# Patient Record
Sex: Female | Born: 1960 | Race: Black or African American | Hispanic: No | Marital: Single
Health system: Midwestern US, Community
[De-identification: ages and names within clinical notes are randomized; demographics above are authoritative.]

## PROBLEM LIST (undated history)

## (undated) DIAGNOSIS — J449 Chronic obstructive pulmonary disease, unspecified: Secondary | ICD-10-CM

## (undated) DIAGNOSIS — I10 Essential (primary) hypertension: Secondary | ICD-10-CM

## (undated) DIAGNOSIS — I671 Cerebral aneurysm, nonruptured: Secondary | ICD-10-CM

## (undated) DIAGNOSIS — F418 Other specified anxiety disorders: Secondary | ICD-10-CM

## (undated) DIAGNOSIS — M79601 Pain in right arm: Secondary | ICD-10-CM

## (undated) DIAGNOSIS — M79602 Pain in left arm: Secondary | ICD-10-CM

## (undated) HISTORY — DX: Essential (primary) hypertension: I10

## (undated) HISTORY — DX: Chronic obstructive pulmonary disease, unspecified: J44.9

---

## 2009-05-26 HISTORY — PX: OTHER SURGICAL HISTORY: SHX169

## 2013-11-03 NOTE — Progress Notes (Signed)
.  1. Have you been to the ER, urgent care clinic since your last visit?  Hospitalized since your last visit?No    2. Have you seen or consulted any other health care providers outside of the [facility] since your last visit?  Include any pap smears or colon screening. No

## 2013-11-03 NOTE — Progress Notes (Signed)
[facility]  Dawn Adaoderick Destony Prevost, MD, [place], CMD  [address]  [address]   [place] [id number]  Phone:  [phone]  Fax: [phone]    Chief Complaint   Patient presents with   ??? Complete Physical       SUBJECTIVE:    Dawn Colon Patient comes in today for evaluation and ongoing care.  She is under the care of Dr. ??(05), neurologist at [facility] for her headaches.  Patient is having sharp headaches.  Basically she states in the area she had the craniotomy.  She had an aneurysm repair October 2011.  They caught it apparently before it ruptured.  Patient also complains of low back pain for the past two or three weeks and denies other new complaints.  Patient is seen for evaluation and ongoing care.          Current Outpatient Prescriptions   Medication Sig Dispense Refill   ??? amLODIPine (NORVASC) 5 mg tablet        ??? gabapentin (NEURONTIN) 600 mg tablet        ??? aspirin delayed-release 81 mg tablet Take  by mouth daily.       ??? clonazePAM (KLONOPIN) 0.5 mg tablet        ??? DULoxetine (CYMBALTA) 30 mg capsule        ??? HYDROcodone-acetaminophen (NORCO) 7.5-325 mg per tablet        ??? ibuprofen (MOTRIN) 600 mg tablet          Past Medical History   Diagnosis Date   ??? Hypertension    ??? Aneurysm (HCC) 2011     [facility]ctomy  [facility]   ??? HA (headache)    ??? LBP (low back pain)    ??? S/P colonoscopy 11-03-13     refused   ??? Depression      psychiatry   ??? Chronic pain      Past Surgical History   Procedure Laterality Date   ??? Pr neurological procedure unlisted       No Known Allergies    REVIEW OF SYSTEMS:  General: negative for - chills or fever  ENT: negative for - headaches, nasal congestion or tinnitus  Respiratory: negative for - cough, hemoptysis, shortness of breath or wheezing  Cardiovascular : negative for - chest pain, edema, palpitations or shortness of breath  Gastrointestinal: negative for - abdominal pain, blood in stools, heartburn or nausea/vomiting  Genito-Urinary: no dysuria,  trouble voiding, or hematuria  Musculoskeletal: negative for - gait disturbance, joint pain, joint stiffness or joint swelling  Neurological: no TIA or stroke symptoms  Hematologic: no bruises, no bleeding, no swollen glands  Integument: no lumps, mole changes, nail changes or rash  Endocrine:no malaise/lethargy or unexpected weight changes      History     Social History   ??? Marital Status: SINGLE     Spouse Name: N/A     Number of Children: N/A   ??? Years of Education: N/A     Social History Main Topics   ??? Smoking status: Current Every Day Smoker   ??? Smokeless tobacco: Never Used   ??? Alcohol Use: No   ??? Drug Use: No   ??? Sexual Activity: Not on file     Other Topics Concern   ??? Not on file     Social History Narrative   ??? No narrative on file     Family History   Problem Relation Age  of Onset   ??? Diabetes Mother    ??? Hypertension Mother    Patient is separated after a marriage in 63 and a separation in 86.  She has two sons, 23 and 43.  She has 19 grandchildren.  She has permanent disability related to the aneurysm.  A good lady friend of her stays with her at home.      Family History:  Mother deceased at 34, diabetes and chronic kidney disease with dialysis.  Father is 84, alive and well.  She has two brothers and three sisters alive and well.         OBJECTIVE:     BP 113/79   Pulse 79   Temp(Src) 97.7 ??F (36.5 ??C) (Oral)   Resp 19   Ht 5\' 5"  (1.651 m)   Wt 171 lb (77.565 kg)   BMI 28.46 kg/m2   SpO2 94%  CONSTITUTIONAL: well , well nourished, appears age appropriate  EYES: perrla, eom intact  ENMT:moist mucous membranes, pharynx clear  NECK: supple. Thyroid normal  RESPIRATORY: Chest: clear to ascultation and percussion   CARDIOVASCULAR: Heart: regular rate and rhythm  GASTROINTESTINAL: Abdomen: soft, bowel sounds active  HEMATOLOGIC: no pathological lymph nodes palpated  MUSCULOSKELETAL: Extremities: no edema, pulse 1+   INTEGUMENT: No unusual rashes or suspicious skin lesions noted. Nails appear normal.   NEUROLOGIC: non-focal exam   MENTAL STATUS: alert and oriented, appropriate affect     ASSESSMENT:   1. Chronic pain    2. Depression    3. Intractable episodic tension-type headache    4. Essential hypertension    5. Aneurysm [facility])      Patient has intractable headaches for which she will need pain management referral and we will give her that today.  She has been on Oxycodone for the pain and the neurologist will not continue the Oxycodone prescription.  He wants the primary care doctor to provide it for her.  We advise her that we are not pain management and therefore cannot give a prescription for Oxycodone.  She also sees a Therapist, sports on [address].  She does not recall the name, who also gave her the name of a pain management specialist.  We will go ahead and try and make an appointment for her because she seems to be very uncomfortable.  She has been followed by psychiatry for depressive disorder.  Patient is a cigarette smoker and we encourage her to discontinue cigarettes.  We also encourage exercise for 30 minutes five days a week, weight to her ideal body weight.  Appropriate laboratory studies will be requested.  We recommend a colonoscopy.  She refused.  We will ask her to give [place] a hemoccult mailer.  She will return to the office in six weeks.  We will do appropriate laboratory studies and send her the results.  We will ask for records from [facility].        PLAN:  .  Orders Placed This Encounter   ??? MAM MAMMO BI SCREENING DIGTL   ??? URINALYSIS W/ RFLX MICROSCOPIC   ??? HEMOGLOBIN A1C   ??? CBC WITH AUTOMATED DIFF   ??? METABOLIC PANEL, COMPREHENSIVE   ??? LIPID PANEL   ??? TSH, 3RD GENERATION   ??? REFERRAL TO PAIN CLINIC   ??? AMB POC EKG ROUTINE W/ 12 LEADS, INTER & REP   ??? amLODIPine (NORVASC) 5 mg tablet   ??? clonazePAM (KLONOPIN) 0.5 mg tablet   ??? DULoxetine (CYMBALTA) 30 mg capsule   ???  gabapentin (NEURONTIN) 600 mg tablet   ??? HYDROcodone-acetaminophen (NORCO) 7.5-325 mg per tablet   ??? ibuprofen (MOTRIN) 600 mg tablet    ??? aspirin delayed-release 81 mg tablet       Follow-up Disposition:  Return in about 6 weeks (around 12/15/2013).    ATTENTION:   This medical record was transcribed using an electronic medical records system.  Although proofread, it may and can contain electronic and spelling errors.  Other human spelling and other errors may be present.  Corrections may be executed at a later time.  Please feel free to contact us for any clarifications as needed.

## 2013-11-04 NOTE — Progress Notes (Signed)
Patient not seen today. She established care with a new PCP yesterday. There was apparent confusion, as the patient thought I am a pain management physician. Patient was encouraged to follow up with her new PCP.

## 2014-03-14 ENCOUNTER — Encounter: Attending: Family

## 2014-03-27 ENCOUNTER — Encounter: Attending: Family

## 2014-04-13 ENCOUNTER — Encounter: Attending: Family

## 2014-05-05 ENCOUNTER — Encounter: Attending: Internal Medicine

## 2014-06-02 ENCOUNTER — Encounter: Attending: Internal Medicine

## 2014-07-17 ENCOUNTER — Encounter: Attending: Internal Medicine

## 2014-10-09 ENCOUNTER — Ambulatory Visit: Admit: 2014-10-09 | Discharge: 2014-10-09 | Payer: PRIVATE HEALTH INSURANCE | Attending: Internal Medicine

## 2014-10-09 ENCOUNTER — Ambulatory Visit: Attending: Internal Medicine

## 2014-10-09 DIAGNOSIS — Z7189 Other specified counseling: Secondary | ICD-10-CM

## 2014-10-09 DIAGNOSIS — Z7689 Persons encountering health services in other specified circumstances: Secondary | ICD-10-CM

## 2014-10-09 MED ORDER — AMLODIPINE 5 MG TAB
5 mg | ORAL_TABLET | Freq: Every day | ORAL | Status: DC
Start: 2014-10-09 — End: 2014-12-31

## 2014-10-09 MED ORDER — ASPIRIN 81 MG TAB, DELAYED RELEASE
81 mg | ORAL_TABLET | Freq: Every day | ORAL | Status: DC
Start: 2014-10-09 — End: 2015-12-10

## 2014-10-09 MED ORDER — MIRTAZAPINE 15 MG TAB
15 mg | ORAL_TABLET | Freq: Every evening | ORAL | Status: DC
Start: 2014-10-09 — End: 2015-03-16

## 2014-10-09 NOTE — Patient Instructions (Signed)
Headache: Care Instructions  Your Care Instructions     Headaches have many possible causes. Most headaches aren't a sign of a more serious problem, and they will get better on their own. Home treatment may help you feel better faster.  The doctor has checked you carefully, but problems can develop later. If you notice any problems or new symptoms, get medical treatment right away.  Follow-up care is a key part of your treatment and safety. Be sure to make and go to all appointments, and call your doctor if you are having problems. It's also a good idea to know your test results and keep a list of the medicines you take.  How can you care for yourself at home?  ?? Do not drive if you have taken a prescription pain medicine.  ?? Rest in a quiet, dark room until your headache is gone. Close your eyes and try to relax or go to sleep. Don't watch TV or read.  ?? Put a cold, moist cloth or cold pack on the painful area for 10 to 20 minutes at a time. Put a thin cloth between the cold pack and your skin.  ?? Use a warm, moist towel or a heating pad set on low to relax tight shoulder and neck muscles.  ?? Have someone gently massage your neck and shoulders.  ?? Take pain medicines exactly as directed.  ?? If the doctor gave you a prescription medicine for pain, take it as prescribed.  ?? If you are not taking a prescription pain medicine, ask your doctor if you can take an over-the-counter medicine.  ?? Be careful not to take pain medicine more often than the instructions allow, because you may get worse or more frequent headaches when the medicine wears off.  ?? Do not ignore new symptoms that occur with a headache, such as a fever, weakness or numbness, vision changes, or confusion. These may be signs of a more serious problem.  To prevent headaches  ?? Keep a headache diary so you can figure out what triggers your headaches. Avoiding triggers may help you prevent headaches. Record when  each headache began, how long it lasted, and what the pain was like (throbbing, aching, stabbing, or dull). Write down any other symptoms you had with the headache, such as nausea, flashing lights or dark spots, or sensitivity to bright light or loud noise. Note if the headache occurred near your period. List anything that might have triggered the headache, such as certain foods (chocolate, cheese, wine) or odors, smoke, bright light, stress, or lack of sleep.  ?? Find healthy ways to deal with stress. Headaches are most common during or right after stressful times. Take time to relax before and after you do something that has caused a headache in the past.  ?? Try to keep your muscles relaxed by keeping good posture. Check your jaw, face, neck, and shoulder muscles for tension, and try relaxing them. When sitting at a desk, change positions often, and stretch for 30 seconds each hour.  ?? Get plenty of sleep and exercise.  ?? Eat regularly and well. Long periods without food can trigger a headache.  ?? Treat yourself to a massage. Some people find that regular massages are very helpful in relieving tension.  ?? Limit caffeine by not drinking too much coffee, tea, or soda. But don't quit caffeine suddenly, because that can also give you headaches.  ?? Reduce eyestrain from computers by blinking frequently and looking away from   the computer screen every so often. Make sure you have proper eyewear and that your monitor is set up properly, about an arm's length away.  ?? Seek help if you have depression or anxiety. Your headaches may be linked to these conditions. Treatment can both prevent headaches and help with symptoms of anxiety or depression.  When should you call for help?  Call 911 anytime you think you may need emergency care. For example, call if:  ?? You have signs of a stroke. These may include:  ?? Sudden numbness, paralysis, or weakness in your face, arm, or leg, especially on only one side of your body.   ?? Sudden vision changes.  ?? Sudden trouble speaking.  ?? Sudden confusion or trouble understanding simple statements.  ?? Sudden problems with walking or balance.  ?? A sudden, severe headache that is different from past headaches.  Call your doctor now or seek immediate medical care if:  ?? You have a new or worse headache.  ?? Your headache gets much worse.   Where can you learn more?   Go to http://www.healthwise.net/[facility]  Enter M271 in the search box to learn more about "Headache: Care Instructions."   ?? 2006-2016 Healthwise, Incorporated. Care instructions adapted under license by [place] (which disclaims liability or warranty for this information). This care instruction is for use with your licensed healthcare professional. If you have questions about a medical condition or this instruction, always ask your healthcare professional. Healthwise, Incorporated disclaims any warranty or liability for your use of this information.  Content Version: 10.8.513193; Current as of: July 15, 2013

## 2014-10-09 NOTE — Progress Notes (Signed)
Reviewed record  In preparation for visit and have obtained necessary documentation.   1. Have you been to the ER, urgent care clinic since your last visit? Hospitalized since your last visit?no  2. Have you seen or consulted any other health care providers outside of the [facility] since your last visit? Include any pap smears or colon screening. No  Patient declines information on advanced directives.  Had last pap smear per patient a year ago but does not remember where. Last mammogram at [facility].

## 2014-10-09 NOTE — Progress Notes (Signed)
Per lab technician Melissa patient declined having blood drawn for labs today.

## 2014-10-09 NOTE — Progress Notes (Signed)
CC:  Chief Complaint   Patient presents with   ??? Establish Care   ??? Migraine     rated at scale of 8       HISTORY OF PRESENT ILLNESS  Dawn Colon is a 54 y.o. female.    Presents to establish care. Previously seen once by Dr. Oneida AlarHaithcock on 11/03/13. She has HTN, migraine headaches since having cerebral aneurysm repair on 03/25/2010 at [facility], depression, chronic pain (headaches, shoulders, elbows), and polysubstance abuse (heroin and cocaine). Needs refills on BP medications.     Today she complains of headaches and chronic pain. Migraine headaches affect right side of her head; 8/10 in severity today, no associated aura, nausea, or photophobia. States that nothing helps her headaches. Follows with [facility] Park BreedKahn (Neurology[facility]. Depression diagnosed about a year ago. Denies depressed mood. Problems falling asleep, poor appetite. Reports she has lost about 50 lbs over the past 2 months. Denies SI/HI. HTN also diagnosed about a year ago. Not compliant with low salt diet. Does not have BP monitor at home; does not exercise regularly.    Soc Hx  Separated. Lives alone. Has 2 sons ages 2134 and 2728. Unemployed. On disability for aneurysm. Smokes 1 ppd for 39 yrs. Quti drinking a few months ago. Uses heroin and cocaine; last used 3 days ago. [facility]. Does not get regular exercise.    Health Maintenance  Flu vaccine: does not get     Pneumonia vaccine: never had; due for this since she is a smoker  Td: 2015 while incarcerated.      Pap smear: 2015 at [facility]  Mammogram: 2015 at [facility].  Colonoscopy: 2015 at [facility].  Eye exam: due for this  Lipids: due for this  A1c: due for this  Advanced Directives: declined information    ROS  Constitutional: negative for fevers, chills, night sweats; positive for weight loss  Eyes:   negative for visual disturbance and irritation  ENT:   negative for tinnitus, sore throat, nasal congestion, ear pains  Respiratory:  negative for cough, hemoptysis, dyspnea, wheezing   CV:   negative for chest pain, palpitations, lower extremity edema  GI:   negative for heartburn, abd pain, nausea, vomiting, diarrhea, constipation  Endo:               negative for polyuria, polydipsia, polyphagia, heat intolerance; positive for cold intolerance  Genitourinary: negative for frequency, dysuria, hematuria  Integument:  negative for rash and pruritus  Hematologic:  negative for easy bruising and gum/nose bleeding  Musculoskel: negative for muscle weakness  Neurological:  negative for headaches, dizziness, gait problems  Behavl/Psych: negative for feelings of anxiety    Patient Active Problem List   Diagnosis Code   ??? Hypertension I10   ??? HA (headache) R51   ??? Depression F32.9   ??? Aneurysm (HCC) I72.9   ??? Chronic pain G89.29     Past Medical History   Diagnosis Date   ??? Hypertension    ??? Aneurysm (HCC) 2011     crainectomy  [facility]   ??? HA (headache)    ??? LBP (low back pain)    ??? S/P colonoscopy 11-03-13     refused   ??? Depression      psychiatry   ??? Chronic pain      Past Surgical History   Procedure Laterality Date   ??? Pr neurological procedure unlisted     ??? Hx other surgical  2011  brain surgery for aneurysm/[facility]     History     Social History   ??? Marital Status: SINGLE     Spouse Name: N/A   ??? Number of Children: N/A   ??? Years of Education: N/A     Social History Main Topics   ??? Smoking status: Current Every Day Smoker -- 1.00 packs/day for 39 years   ??? Smokeless tobacco: Never Used   ??? Alcohol Use: No   ??? Drug Use: Yes      Comment: cocaine/heroine-states currently using 10/09/14   ??? Sexual Activity: Not on file     Other Topics Concern   ??? None     Social History Narrative     Family History   Problem Relation Age of Onset   ??? Diabetes Mother    ??? Hypertension Mother      No Known Allergies  Current Outpatient Prescriptions   Medication Sig Dispense Refill   ??? amLODIPine (NORVASC) 5 mg tablet            PHYSICAL EXAM  BP 141/100 mmHg   Pulse 74   Temp(Src) 99 ??F (37.2 ??C) (Oral)   Resp 16    Ht  (1.626 m)   Wt 140 lb (63.504 kg)   BMI 24.02 kg/m2   SpO2 98%   LMP     General: Well-developed, well-nourished. In no distress. Somnolent, but answers questions.  Head: Normocephalic, atraumatic.  Eyes: Conjunctiva clear. Pupils equal, round, reactive to light. Extraocular movements intact.   Ears/Nose: Nares normal. Septum midline. Normal nasal mucosa. No drainage or sinus tenderness.   Mouth/Throat: Lips, mucosa, and tongue normal. Oropharynx benign.    Neck: Supple, symmetrical, trachea midline, no lymphadenopathy, no carotid bruits, no JVD, thyroid: not enlarged, symmetric, no tenderness/mass/nodules.  Lungs: Clear to auscultation bilaterally. No crackles or wheezes.   Chest Wall: No tenderness or deformity.  Heart: RRR, normal S1 and S2, no murmur, click, rub, or gallop.  Back: Symmetric. ROM normal. No CVA tenderness.  Abdomen: Soft, non-distended, bowel sounds normal. No tenderness. No masses. No hepatosplenomegaly.  Lymph nodes: Cervical and supraclavicular nodes normal.  Extremities: No clubbing, cyanosis, or edema.   Skin: Skin color, texture, turgor normal.     Pulses: 2+ and symmetric at dorsalis pedis and posterior tibialis pulses.  Neurological: CN II-XII intact. Normal strength and reflexes throughout.   Musculoskeletal: Gait normal. ROM normal at both knees.  Psychiatric: Flat affect. Behavior is normal.      No results found for this or any previous visit.      ASSESSMENT AND PLAN    ICD-10-CM ICD-9-CM    1. Encounter to establish care Z71.89 V65.8    2. Essential hypertension with goal blood pressure less than 140/90 I10 401.9 METABOLIC PANEL, COMPREHENSIVE      CBC WITH AUTOMATED DIFF      amLODIPine (NORVASC) 5 mg tablet   3. Chronic daily headache R51 784.0    4. Depression with anxiety F41.8 300.4 mirtazapine (REMERON) 15 mg tablet   5. Cerebral aneurysm I67.1 437.3 aspirin delayed-release 81 mg tablet   6. Weight loss R63.4 783.21 TSH AND FREE T4       HIV SCREEN, 4TH GEN. W/REFLEX CONFIRM   7. Cold intolerance R68.89 780.99 TSH AND FREE T4   8. Heroin abuse F11.10 305.50 DRUG SCREEN, URINE - IMMUNOASSAY 9 W/REFLEX CONFIRM      REFERRAL TO REHABILITATION   9. Cocaine abuse F14.10 305.60 DRUG SCREEN, URINE -  IMMUNOASSAY 9 W/REFLEX CONFIRM      REFERRAL TO REHABILITATION   10. Lipid screening Z13.220 V77.91 LIPID PANEL   11. Diabetes mellitus screening Z13.1 V77.1 HEMOGLOBIN A1C WITH EAG   12. Need for hepatitis C screening test Z11.59 V73.89 HEPATITIS C AB       Patrice ParadiseWendy Fake was seen today to establish care.    Diagnoses and all orders for this visit:    Encounter to establish care  [facility].    Essential hypertension with goal blood pressure less than 140/90  Not at goal. BP 140/100 today. Has been out of BP med for several days.  Orders:  -     METABOLIC PANEL, COMPREHENSIVE  -     CBC WITH AUTOMATED DIFF  -     Refill amLODIPine (NORVASC) 5 mg tablet; Take 1 Tab by mouth daily. Indications: HYPERTENSION    Chronic daily headache    Depression with anxiety  Orders:  -     Start mirtazapine (REMERON) 15 mg tablet; Take 1 Tab by mouth nightly. For depression and sleep.    Cerebral aneurysm  Orders:  -     Start aspirin delayed-release 81 mg tablet; Take 1 Tab by mouth daily. For stroke prevention.    Weight loss  Orders:  -     TSH AND FREE T4  -     HIV SCREEN, 4TH GEN. W/REFLEX CONFIRM    Cold intolerance  Orders:  -     TSH AND FREE T4    Heroin abuse  Orders:  -     DRUG SCREEN, URINE - IMMUNOASSAY 9 W/REFLEX CONFIRM  -     REFERRAL TO REHABILITATION    Cocaine abuse  Orders:  -     DRUG SCREEN, URINE - IMMUNOASSAY 9 W/REFLEX CONFIRM  -     REFERRAL TO REHABILITATION ([facility])    Lipid screening  Orders:  -     LIPID PANEL    Diabetes mellitus screening  Orders:  -     HEMOGLOBIN A1C WITH EAG    Need for hepatitis C screening test  Orders:  -     HEPATITIS C AB      Follow-up Disposition:   Return in about 1 month (around 11/09/2014), or if symptoms worsen or fail to improve, for Depression, HTN, insomnia, weight loss.    Provided patient and/or family with advanced directive information and answered pertinent questions.  Encouraged patient to provide a copy of advanced directive to the office when available.    I have discussed the diagnosis with the patient and the intended plan as seen in the above orders. Patient is in agreement.  The patient has received an after-visit summary and questions were answered concerning future plans.  I have discussed medication side effects and warnings with the patient as well.

## 2014-10-16 LAB — COCAINE, CONFIRM
Cocaine + Metabolite: POSITIVE — AB
Cocaine confirm: >3000 ng/mL

## 2014-10-16 LAB — 10-DRUG SCREEN, URINE W RFLX CONFIRMATION
Alprazolam: NEGATIVE
Amphetamine Screen, urine: NEGATIVE ng/mL
Barbiturates Screen, urine: NEGATIVE ng/mL
Benzodiazepines: NEGATIVE ng/mL
Cannabinoid Screen, urine: NEGATIVE ng/mL
Clonazepam: NEGATIVE
Creatinine, urine: 201.2 mg/dL (ref 20.0–300.0)
Flurazepam: NEGATIVE
Lorazepam: NEGATIVE
Methadone Screen, urine: NEGATIVE ng/mL
Midazolam: NEGATIVE
Nordiazepam: NEGATIVE
Oxazepam: NEGATIVE
Oxycodone/Oxymorphone, urine: NEGATIVE ng/mL
Phencyclidine Screen, urine: NEGATIVE ng/mL
Propoxyphene Screen, urine: NEGATIVE ng/mL
Specific Gravity: 1.019
Temazepam: NEGATIVE
Triazolam: NEGATIVE
pH, urine: 6 (ref 4.5–8.9)

## 2014-10-16 LAB — OPIATES CONFIRMATION, URINE
Codeine: NEGATIVE
Hydrocodone: NEGATIVE
Hydromorphone confirm: 112 ng/mL
Hydromorphone: POSITIVE — AB
Morphine confirm: >3000 ng/mL
Morphine: POSITIVE — AB
Opiates: POSITIVE ng/mL — AB

## 2014-10-19 NOTE — Progress Notes (Signed)
Quick Note:        Inform patient that her urine drug screen was positive for cocaine and heroin. Dr. Carson MyrtleEssah would like her to return to clinic for her blood tests. Also, if she has not heard from [facility] for drug rehab, she should call them. Their number is on the referral sheet.    ______

## 2014-10-19 NOTE — Progress Notes (Signed)
Quick Note:        Left message for patient to call the office.    ______

## 2014-10-20 NOTE — Progress Notes (Signed)
Quick Note:        Left message for patient to call the office.    ______

## 2014-10-24 NOTE — Progress Notes (Signed)
Quick Note:        I have been unable to reach this patient by phone. A letter is being sent.    ______

## 2014-11-13 ENCOUNTER — Encounter: Attending: Internal Medicine

## 2014-11-13 NOTE — Progress Notes (Signed)
Patient failed to keep appointment on 11/13/14.  The following steps were taken: provider notified for review of record

## 2014-12-31 MED ORDER — AMLODIPINE 5 MG TAB
5 mg | ORAL_TABLET | ORAL | Status: DC
Start: 2014-12-31 — End: 2015-05-05

## 2014-12-31 NOTE — Telephone Encounter (Signed)
Appointment needed.

## 2015-01-15 NOTE — Telephone Encounter (Signed)
Please call patient and schedule an appointment.

## 2015-03-06 ENCOUNTER — Encounter: Attending: Internal Medicine

## 2015-03-12 ENCOUNTER — Encounter

## 2015-03-16 ENCOUNTER — Encounter

## 2015-03-16 MED ORDER — MIRTAZAPINE 15 MG TAB
15 mg | ORAL_TABLET | Freq: Every evening | ORAL | 0 refills | Status: DC
Start: 2015-03-16 — End: 2015-11-13

## 2015-05-01 NOTE — Progress Notes (Signed)
[facility] latest [facility] note and labs faxed to [phone].

## 2015-05-05 ENCOUNTER — Encounter

## 2015-05-05 MED ORDER — AMLODIPINE 5 MG TAB
5 mg | ORAL_TABLET | ORAL | 2 refills | Status: DC
Start: 2015-05-05 — End: 2015-08-17

## 2015-06-08 NOTE — Telephone Encounter (Signed)
Request from patients pharmacy for quetiapine fumarate 100 mg tablet take 1 tablet at bedtime previously prescribed by Dr Irineo AxonShama Saiyed(psych). Patient has not been seen since first visit on 10/09/14 and did not follow up as directed in 6/16. Unable to reach patient (attempted times three)to advise her that we cannot fill this medication request. Pharmacy [facility] advised via fax.

## 2015-06-21 ENCOUNTER — Encounter: Attending: Internal Medicine

## 2015-06-21 NOTE — Progress Notes (Signed)
Patient failed to keep appointment on 06/21/15.  The following steps were taken: provider notified for review of record

## 2015-08-17 ENCOUNTER — Encounter

## 2015-08-17 MED ORDER — AMLODIPINE 5 MG TAB
5 mg | ORAL_TABLET | Freq: Every day | ORAL | 1 refills | Status: DC
Start: 2015-08-17 — End: 2016-01-21

## 2015-11-13 ENCOUNTER — Emergency Department: Payer: MEDICAID

## 2015-11-13 ENCOUNTER — Emergency Department: Admit: 2015-11-13 | Payer: MEDICAID

## 2015-11-13 ENCOUNTER — Inpatient Hospital Stay
Admit: 2015-11-13 | Discharge: 2015-11-13 | Disposition: A | Discharge status: Home / Self Care | Payer: MEDICAID | Attending: Emergency Medicine

## 2015-11-13 DIAGNOSIS — M25562 Pain in left knee: Secondary | ICD-10-CM

## 2015-11-13 MED ORDER — HYDROCODONE-ACETAMINOPHEN 5 MG-325 MG TAB
5-325 mg | ORAL_TABLET | Freq: Four times a day (QID) | ORAL | 0 refills | Status: DC | PRN
Start: 2015-11-13 — End: 2016-09-03

## 2015-11-13 MED ORDER — MELOXICAM 15 MG TAB
15 mg | ORAL_TABLET | Freq: Every day | ORAL | 0 refills | Status: AC
Start: 2015-11-13 — End: 2015-11-23

## 2015-11-13 MED ORDER — OXYCODONE-ACETAMINOPHEN 5 MG-325 MG TAB
5-325 mg | ORAL | Status: AC
Start: 2015-11-13 — End: 2015-11-13
  Administered 2015-11-13: 14:00:00 via ORAL

## 2015-11-13 MED FILL — OXYCODONE-ACETAMINOPHEN 5 MG-325 MG TAB: 5-325 mg | ORAL | Qty: 1

## 2015-11-13 NOTE — ED Provider Notes (Signed)
HPI Comments: Dawn AlbertsWendy R Colon is a 55 y.o. female with PMHx of chronic pain, presenting per EMS to ED c/o constant 8/10 RLE pain from the knee to the foot since GLF yesterday. Pt reports she is unable to bear weight on her RLE secondary to pain. She endorses onset of RLE pain s/p slipping and falling to the hard dirt ground while walking in the rain yesterday. Pt notes she twisted her RLE during the fall. She denies history of knee/back/neck injuries. She specifically denies hitting her head and LOC.      PCP: Christin FudgePaulina Essah, MD  Social Hx: current every day smoker (1 ppd); - EtOH; + drug use (cocaine/heroin);    There are no other complaints, changes, or physical findings at this time.  Written by Daniel NonesAlexandra Conroy, ED Scribe, as dictated by Erasmo Leventhalhristi Demario Faniel, PAC.           The history is provided by the patient and the EMS personnel.        Past Medical History:   Diagnosis Date   ??? 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 Chronic pain    ??? Depression     psychiatry   ??? HA (headache)    ??? Hypertension    ??? LBP (low back pain)    ??? S/P colonoscopy 11-03-13    refused       Past Surgical History:   Procedure Laterality Date   ??? HX OTHER SURGICAL  2011    brain surgery for aneurysm/mcv   ??? NEUROLOGICAL PROCEDURE UNLISTED           Family History:   Problem Relation Age of Onset   ??? Diabetes Mother    ??? Hypertension Mother        Social History     Social History   ??? Marital status: SINGLE     Spouse name: N/A   ??? Number of children: N/A   ??? Years of education: N/A     Occupational History   ??? Not on file.     Social History Main Topics   ??? Smoking status: Current Every Day Smoker     Packs/day: 1.00     Years: 39.00   ??? Smokeless tobacco: Never Used   ??? Alcohol use No   ??? Drug use: Yes      Comment: cocaine/heroine-states currently using 10/09/14   ??? Sexual activity: Not on file     Other Topics Concern   ??? Not on file     Social History Narrative          ALLERGIES: Review of patient's allergies indicates no known allergies.    Review of Systems   Constitutional: Negative for chills and fever.   HENT: Negative for congestion, rhinorrhea and sore throat.    Respiratory: Negative for cough and shortness of breath.    Cardiovascular: Negative for chest pain and palpitations.   Gastrointestinal: Negative for abdominal pain, diarrhea, nausea and vomiting.   Genitourinary: Negative for dysuria and hematuria.   Musculoskeletal: Negative for neck pain and neck stiffness.        + RLE pain from the knee to the foot;   Skin: Negative for rash and wound.   Neurological: Negative for dizziness, syncope and headaches.   Psychiatric/Behavioral: Negative for agitation and confusion.   All other systems reviewed and are negative.      Vitals:    11/13/15 0912   BP: 117/76   Pulse:  82   Resp: 16   Temp: 98.4 ??F (36.9 ??C)   SpO2: 98%   Weight: 77.1 kg (170 lb)   Height:  (1.651 m)            Physical Exam   Constitutional: She is oriented to person, place, and time. She appears well-developed and well-nourished. No distress.   HENT:   Head: Normocephalic and atraumatic.   Nose: Nose normal.   Mouth/Throat: Oropharynx is clear and moist. No oropharyngeal exudate.   Eyes: Conjunctivae and EOM are normal. Right eye exhibits no discharge. Left eye exhibits no discharge. No scleral icterus.   Neck: Normal range of motion. Neck supple. No JVD present. No tracheal deviation present. No thyromegaly present.   Cardiovascular: Normal rate, regular rhythm and normal heart sounds.    Pulmonary/Chest: Effort normal and breath sounds normal. No respiratory distress. She has no wheezes.   Abdominal: Soft. There is no tenderness.   Musculoskeletal: She exhibits no edema.   RLE: decreased active/passive ROM of the right knee due to tenderness, mild effusion noted; diffusely tender to any attempt to palpate the right knee, right tibia, and right foot; 2+ distal pulses; NVI; non-weight  bearing due to tenderness;    Lymphadenopathy:     She has no cervical adenopathy.   Neurological: She is alert and oriented to person, place, and time. She exhibits normal muscle tone. Coordination normal.   Skin: Skin is warm and dry. She is not diaphoretic.   Psychiatric: She has a normal mood and affect. Her behavior is normal. Judgment normal.   Nursing note and vitals reviewed.       MDM  Number of Diagnoses or Management Options  Diagnosis management comments: DDx: fracture, contusion, strain, ligamentous injury.        Amount and/or Complexity of Data Reviewed  Tests in the radiology section of CPT??: ordered and reviewed  Obtain history from someone other than the patient: yes (EMS)  Review and summarize past medical records: yes  Independent visualization of images, tracings, or specimens: yes    Patient Progress  Patient progress: stable    Procedures    IMAGING RESULTS:  EXAM: XR KNEE RT 3 V  ??  INDICATION: R knee pain, s/p fall.  ??  COMPARISON: None.  ??  FINDINGS: Three views of the right knee demonstrate no fracture or other acute  osseous or articular abnormality. There is no effusion.  ??  IMPRESSION  IMPRESSION: No acute abnormality.  ??  ??  ??   ??     EXAM: XR FOOT RT MIN 3 V  ??  INDICATION: R foot pain, s/p fall.  ??  COMPARISON: None.  ??  FINDINGS: Three views of the right foot demonstrate no fracture or other acute  osseous or articular abnormality. The soft tissues are within normal limits.  ??  IMPRESSION  IMPRESSION: No acute abnormality.  ??  ??  ??   ??     EXAM: XR TIB/FIB RT  ??  INDICATION: Status post fall with right knee pain.  ??  COMPARISON: None.  ??  FINDINGS: AP and lateral views of the right tibia and fibula demonstrate no  fracture or other acute osseous, articular or soft tissue abnormality.  ??  IMPRESSION  IMPRESSION: No acute abnormality.  ??   ??     MEDICATIONS GIVEN:  Medications   oxyCODONE-acetaminophen (PERCOCET) 5-325 mg per tablet 1 Tab (not administered)       IMPRESSION:  1. Pain of knee and lower leg, right    2. Right foot pain    3. Fall, initial encounter    4. Depression, unspecified depression type    5. Other chronic pain    6. Tobacco dependence        PLAN:  1.   Current Discharge Medication List      START taking these medications    Details   meloxicam (MOBIC) 15 mg tablet Take 1 Tab by mouth daily for 10 days.  Qty: 10 Tab, Refills: 0      HYDROcodone-acetaminophen (LORTAB 5-325) 5-325 mg per tablet Take 1 Tab by mouth every six (6) hours as needed for Pain for up to 15 doses. Max Daily Amount: 4 Tabs.  Qty: 15 Tab, Refills: 0           2.   Follow-up Information     Follow up With Details Comments Contact Info    [name], MD   [address] [place] [address]  [phone]      Marliss Coots, MD  As needed [address]  [address]  [place] [address]  [phone]      [facility]  If symptoms worsen [address]  [place] [address]  [phone]        Return to ED if worse     DISCHARGE NOTE  10:40 AM  The patient has been re-evaluated and is ready for discharge. Reviewed available results with patient. Counseled pt on diagnosis and care plan. Pt has expressed understanding, and all questions have been answered. Pt agrees with plan and agrees to F/U as recommended, or return to the ED if their sxs worsen. Discharge instructions have been provided and explained to the pt, along with reasons to return to the ED.  Written by Daniel Nones, ED Scribe, as dictated by Erasmo Leventhal, PAC.    This note is prepared by Daniel Nones, acting as Scribe for [employer], [facility].    [place] : The scribe's documentation has been prepared under my direction and personally reviewed by me in its entirety. I confirm that the note above accurately reflects all work, treatment, procedures, and medical decision making performed by me.

## 2015-11-13 NOTE — ED Notes (Signed)
Knee immobilizer placed and pt performed well with crutches prior to discharge.  Pt verbalized understanding of dc inst and wheeled out to waiting room waiting on sister-in-law.

## 2015-11-13 NOTE — ED Notes (Signed)
Received patient to exam room.  Pt in bed in position of comfort and call bell within reach. Pt slipped and fell yesterday and is complaining of pain in right leg from knee down to foot with decreased strength and rom.

## 2015-11-13 NOTE — ED Notes (Signed)
Pt back from xray, medicated, and ice applied

## 2015-12-10 ENCOUNTER — Encounter

## 2015-12-10 MED ORDER — ASPIRIN 81 MG TAB, DELAYED RELEASE
81 mg | ORAL_TABLET | Freq: Every day | ORAL | 0 refills | Status: DC
Start: 2015-12-10 — End: 2017-01-27

## 2016-01-18 ENCOUNTER — Encounter

## 2016-01-18 NOTE — Telephone Encounter (Signed)
Patient has not been seen in over a year. She needs to make an appointment before getting refills.

## 2016-01-21 MED ORDER — AMLODIPINE 5 MG TAB
5 mg | ORAL_TABLET | Freq: Every day | ORAL | 0 refills | Status: DC
Start: 2016-01-21 — End: 2017-01-27

## 2016-01-21 NOTE — Telephone Encounter (Signed)
VM left for return call to office.

## 2016-01-21 NOTE — Telephone Encounter (Signed)
Pt scheduled appt for 9/11. Would like to know if she can get refill in the interim.

## 2016-01-21 NOTE — Addendum Note (Signed)
Addended byAlbesa Seen on: 01/21/2016 11:52 AM      Modules accepted: Orders

## 2016-02-04 ENCOUNTER — Encounter: Attending: Internal Medicine

## 2016-08-14 ENCOUNTER — Encounter: Attending: Internal Medicine

## 2016-09-03 ENCOUNTER — Inpatient Hospital Stay
Admit: 2016-09-03 | Discharge: 2016-09-03 | Disposition: A | Discharge status: Home / Self Care | Payer: MEDICAID | Attending: Emergency Medicine

## 2016-09-03 DIAGNOSIS — G8929 Other chronic pain: Secondary | ICD-10-CM

## 2016-09-03 MED ORDER — TRAMADOL 50 MG TAB
50 mg | ORAL | Status: AC
Start: 2016-09-03 — End: 2016-09-03
  Administered 2016-09-03: 19:00:00 via ORAL

## 2016-09-03 MED ORDER — HYDROCODONE-ACETAMINOPHEN 5 MG-325 MG TAB
5-325 mg | ORAL_TABLET | Freq: Four times a day (QID) | ORAL | 0 refills | Status: DC | PRN
Start: 2016-09-03 — End: 2016-09-16

## 2016-09-03 MED ORDER — GABAPENTIN 100 MG CAP
100 mg | ORAL_CAPSULE | Freq: Three times a day (TID) | ORAL | 0 refills | Status: AC
Start: 2016-09-03 — End: 2016-09-13

## 2016-09-03 MED FILL — TRAMADOL 50 MG TAB: 50 mg | ORAL | Qty: 1

## 2016-09-03 NOTE — ED Notes (Signed)
Pt rang out requesting pain medication     PA Fox aware.

## 2016-09-03 NOTE — ED Triage Notes (Signed)
Pt on phone attempting to contact ride

## 2016-09-03 NOTE — ED Notes (Signed)
Pt medicated as ordered Pt agitated with pain medication of choice Pt states "that ain't going to work I had that in the hospital"     PA Fox aware.

## 2016-09-03 NOTE — ED Notes (Signed)
PA Fox bedside     Pt reports fall 3/18 and reports when she left hospital they gave her Rx for Gabapentin. Pt states she has appt on 4/17 with PCP    Pt alert oriented x 4

## 2016-09-03 NOTE — ED Provider Notes (Signed)
EMERGENCY DEPARTMENT HISTORY AND PHYSICAL EXAM      Date: 09/03/2016  Patient Name: Dawn Colon    History of Presenting Illness     Chief Complaint   Patient presents with   ??? Arm Pain     bil arm pain, chronic   ??? Medication Refill     ran out of gabapentin       History Provided By: Patient    HPI: Dawn Colon, 56 y.o. female with PMHx significant for HTN / Aneurysm / Depression, presents via EMS to the ED with cc of ongoing BL aching arm pain s/p fall on 08/10/16. After fall, pt was seen in an ED where they gave her Gabapentin, which she has ran out of. She denies any new injuries or falls since then. Pt denies any chest pain, SOB or fever.     PCP: [name], MD    Social Hx: - etOH, 1 ppd tobacco, elicit drugs (cocaine and heroine since 09/2014)    There are no other complaints, changes, or physical findings at this time.    Current Outpatient Prescriptions   Medication Sig Dispense Refill   ??? gabapentin (NEURONTIN) 100 mg capsule Take 1 Cap by mouth three (3) times daily for 10 days. 30 Cap 0   ??? HYDROcodone-acetaminophen (NORCO) 5-325 mg per tablet Take 1 Tab by mouth every six (6) hours as needed for Pain for up to 6 doses. Max Daily Amount: 4 Tabs. 6 Tab 0   ??? amLODIPine (NORVASC) 5 mg tablet Take 1 Tab by mouth daily. CLINIC APPOINTMENT NEEDED PRIOR TO FURTHER REFILLS. 30 Tab 0   ??? aspirin delayed-release 81 mg tablet Take 1 Tab by mouth daily. CLINIC APPOINTMENT NEEDED PRIOR TO GETTING FURTHER REFILLS [id number]). 90 Tab 0       Past History     Past Medical History:  Past Medical History:   Diagnosis Date   ??? 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 Chronic pain    ??? Depression     psychiatry   ??? HA (headache)    ??? Hypertension    ??? LBP (low back pain)    ??? S/P colonoscopy 11-03-13    refused       Past Surgical History:  Past Surgical History:   Procedure Laterality Date   ??? HX OTHER SURGICAL  2011    brain surgery for aneurysm/mcv   ??? NEUROLOGICAL PROCEDURE UNLISTED         Family History:   Family History   Problem Relation Age of Onset   ??? Diabetes Mother    ??? Hypertension Mother        Social History:  Social History   Substance Use Topics   ??? Smoking status: Current Every Day Smoker     Packs/day: 1.00     Years: 39.00   ??? Smokeless tobacco: Never Used   ??? Alcohol use No       Allergies:  No Known Allergies      Review of Systems   Review of Systems   Constitutional: Negative for chills and fever.   HENT: Negative for congestion, rhinorrhea and sore throat.    Eyes: Negative for photophobia and visual disturbance.   Respiratory: Negative for cough and shortness of breath.    Cardiovascular: Negative for chest pain and palpitations.   Gastrointestinal: Negative for abdominal pain, diarrhea, nausea and vomiting.   Endocrine: Negative for polydipsia, polyphagia and polyuria.  Genitourinary: Negative for dysuria and hematuria.   Musculoskeletal: Negative for neck pain and neck stiffness.        Positive for arm pain   Skin: Negative for rash and wound.   Allergic/Immunologic: Negative for food allergies and immunocompromised state.   Neurological: Negative for dizziness, weakness, numbness and headaches.   Psychiatric/Behavioral: Negative for agitation and confusion. The patient is nervous/anxious.        Physical Exam   Physical Exam   Constitutional: She is oriented to person, place, and time. She appears well-developed and well-nourished.   Tearful on exam  Moaning in discomfort  Seated in wheelchair   HENT:   Head: Normocephalic and atraumatic.   Nose: Nose normal.   Mouth/Throat: Oropharynx is clear and moist. No oropharyngeal exudate.   Eyes: Conjunctivae and EOM are normal. Right eye exhibits no discharge. Left eye exhibits no discharge. No scleral icterus.   Neck: Normal range of motion. Neck supple. No JVD present. No tracheal deviation present. No thyromegaly present.   No cervical spinal tenderness   Cardiovascular: Normal rate, regular rhythm and normal heart sounds.     Pulmonary/Chest: Effort normal and breath sounds normal. No respiratory distress. She has no wheezes.   Abdominal: Soft. There is no tenderness.   No CVAT   Musculoskeletal: She exhibits tenderness. She exhibits no edema.   Decreased A/P ROM to bilat UE due to tenderness, no deformity noted, 2+ radial pulses, NVI, sensation grossly intact to light touch.  No spinal tenderness   Lymphadenopathy:     She has no cervical adenopathy.   Neurological: She is alert and oriented to person, place, and time. She exhibits normal muscle tone. Coordination normal.   Skin: Skin is warm and dry. No rash noted. She is not diaphoretic. No erythema.   Psychiatric: She has a normal mood and affect. Her behavior is normal. Judgment normal.   Nursing note and vitals reviewed.        Diagnostic Study Results     Labs -   No results found for this or any previous visit (from the past 12 hour(s)).    Radiologic Studies -   No orders to display       Medical Decision Making   I am the first provider for this patient.    I reviewed the vital signs, available nursing notes, past medical history, past surgical history, family history and social history.    Vital Signs-Reviewed the patient's vital signs.  Patient Vitals for the past 12 hrs:   Temp Pulse Resp BP SpO2   09/03/16 1352 97.9 ??F (36.6 ??C) 100 22 117/79 99 %     Records Reviewed: Nursing Notes and Old Medical Records    Provider Notes (Medical Decision Making):   DDx: Exacerbation of chronic pain, Radiculopathy.     ED Course:   Initial assessment performed. The patients presenting problems have been discussed, and they are in agreement with the care plan formulated and outlined with them.  I have encouraged them to ask questions as they arise throughout their visit.    Critical Care Time:   0 minutes    Disposition:  DISCHARGE NOTE:  3:29 PM  The patient's results have been reviewed with family and/or caregiver. They verbally convey their understanding and agreement of the patient's  signs, symptoms, diagnosis, treatment, and prognosis. They additionally agree to follow up as recommended in the discharge instructions or to return to the Emergency Room should the patient's condition change prior  to their follow-up appointment. The family and/or caregiver verbally agrees with the care-plan and all of their questions have been answered. The discharge instructions have also been provided to the them along with educational information regarding the patient's diagnosis and a list of reasons why the patient would want to return to the ER prior to their follow-up appointment should their condition change.  Written by Alonza Bogus. Harlene Ramus, ED Scribe, as dictated by Joetta Manners.    PLAN:  1.   Current Discharge Medication List      START taking these medications    Details   gabapentin (NEURONTIN) 100 mg capsule Take 1 Cap by mouth three (3) times daily for 10 days.  Qty: 30 Cap, Refills: 0      HYDROcodone-acetaminophen (NORCO) 5-325 mg per tablet Take 1 Tab by mouth every six (6) hours as needed for Pain for up to 6 doses. Max Daily Amount: 4 Tabs.  Qty: 6 Tab, Refills: 0    Associated Diagnoses: Chronic pain of both upper extremities           2.   Follow-up Information     Follow up With Details Comments Contact Info    [name], MD   [address]  [place] [address]  [phone]      [facility]  If symptoms worsen [address]  [place] [address]  [phone]        Return to ED if worse     Diagnosis     Clinical Impression:   1. Chronic pain of both upper extremities    2. Anxiety    3. Depression, unspecified depression type    4. Tobacco dependence        Attestations:    This note is prepared by Neama K. Civil engineer, contracting, acting as Neurosurgeon for [employer].    PA-C Joliene Salvador: The scribe's documentation has been prepared under my direction and personally reviewed by me in its entirety. I confirm that  the note above accurately reflects all work, treatment, procedures, and medical decision making performed by me.

## 2016-09-16 ENCOUNTER — Ambulatory Visit: Admit: 2016-09-16 | Discharge: 2016-09-16 | Payer: PRIVATE HEALTH INSURANCE | Attending: Internal Medicine

## 2016-09-16 DIAGNOSIS — M79602 Pain in left arm: Secondary | ICD-10-CM

## 2016-09-16 MED ORDER — AMITRIPTYLINE 25 MG TAB
25 mg | ORAL_TABLET | ORAL | 0 refills | Status: DC
Start: 2016-09-16 — End: 2017-01-27

## 2016-09-16 MED ORDER — CAPSAICIN 0.075 % TOPICAL CREAM
0.075 % | Freq: Three times a day (TID) | CUTANEOUS | 0 refills | Status: DC
Start: 2016-09-16 — End: 2017-01-27

## 2016-09-16 NOTE — Progress Notes (Signed)
CC:   Chief Complaint   Patient presents with   ??? Hospital Follow Up   ??? Arm Pain     bilateral       HISTORY OF PRESENT ILLNESS  Dawn Colon is a 56 y.o. female.    Presents for evaluation.  Last seen in clinic on 10/09/14.  Did not follow up as instructed.  She has HTN, migraine headaches since having cerebral aneurysm repair on 03/25/2010 at [facility], depression, chronic pain (headaches, shoulders, elbows), polysubstance abuse (heroin, cocaine, alcohol), and noncompliance.      She was hospitalized at [facility] from 07/26/16-08/09/16 after being found lying on the ground outdoors for an unknown period of time by police.  Diagnosed with Strep viridans sepsis, acute hypotension, stage 3 AKI, rhabdomyolysis, acute liver injury, bilateral UE weakness, NSTEMI due to drug abuse, polysubstance abuse, and HTN.  Had been using heroin and cocaine prior to admission.    Today she complains of bilateral arm pain since March.  Constant aching, located from shoulders to fingers, associated with tingling at finger tips, 10/10, worse with raising arms.  She states that nothing helps her pain except for hydrocodone that she received through the ED on 09/03/16 but states she only got 6 tablets; has tried Tramadol, ibuprofen, Tylenol, and BC powder without relief.  Sleeps poorly.  ??  Soc Hx  Separated. Lives alone. Has 2 adult sons. Unemployed. On disability for aneurysm. Smokes 1 ppd for 41 yrs. States she has had no alcohol since hospitalization in March. Uses heroin and cocaine; will not state today when she last used. Was in [place] for substance abuse in 1999. Does not get regular exercise. ??    Health Maintenance  Flu vaccine: declined                                                                Pneumonia vaccine: never had; due for this since she is a smoker, declined  Td: 2015 while incarcerated.                                                                                Pap smear: 2015 at [facility]  Mammogram: 2015 at [facility].   Colonoscopy: 2015 at [facility].  Eye exam: declined  Lipids: declined  A1c: declined  Advanced Directives: declined information    ROS  A complete review of systems was performed and is negative except for those mentioned in the HPI.    Patient Active Problem List   Diagnosis Code   ??? Hypertension I10   ??? HA (headache) R51   ??? Depression F32.9   ??? Aneurysm (HCC) I72.9   ??? Chronic pain G89.29   ??? Streptococcus viridans sepsis A40.8   ??? Non-ST elevation MI (NSTEMI) (HCC) I21.4     Past Medical History:   Diagnosis Date   ??? 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 Chronic pain    ??? Depression  psychiatry   ??? HA (headache)    ??? Hypertension    ??? LBP (low back pain)    ??? S/P colonoscopy 11-03-13    refused     No Known Allergies    Current Outpatient Prescriptions   Medication Sig Dispense Refill   ??? amLODIPine (NORVASC) 5 mg tablet Take 1 Tab by mouth daily. CLINIC APPOINTMENT NEEDED PRIOR TO FURTHER REFILLS. 30 Tab 0   ??? aspirin delayed-release 81 mg tablet Take 1 Tab by mouth daily. CLINIC APPOINTMENT NEEDED PRIOR TO GETTING FURTHER REFILLS [phone]). 90 Tab 0         PHYSICAL EXAM  Visit Vitals   ??? BP 117/79 (BP 1 Location: Right arm, BP Patient Position: Sitting)   ??? Pulse 66   ??? Temp 98 ??F (36.7 ??C) (Oral)   ??? Resp 16   ??? Ht  (1.626 m)   ??? Wt 188 lb (85.3 kg)   ??? SpO2 98%   ??? BMI 32.27 kg/m2       General: Obese, no distress. Disheveled.  HEENT:  Head normocephalic/atraumatic, no scleral icterus  Neck: Supple. No carotid bruits, JVD, lymphadenopathy, or thyromegaly.  Lungs:  Clear to ausculation bilaterally. Good air movement.  Heart:  Regular rate and rhythm, normal S1 and S2, no murmur, gallop, or rub  Extremities: No clubbing, cyanosis, or edema.   Neurological: Alert and oriented. Normal MS and DTR's throughout.  Musculoskeletal: Tenderness to palpation diffusely. Decreased ROM at both shoulders with abduction.  Psychiatric: Tearful. Behavior is normal but seems restless.         ASSESSMENT AND PLAN     ICD-10-CM ICD-9-CM    1. Bilateral arm pain M79.601 729.5 CK    M79.602  XR SHOULDER LT AP/LAT MIN 2 V      XR SHOULDER RT AP/LAT MIN 2 V      XR SPINE CERV 4 OR 5 V      amitriptyline (ELAVIL) 25 mg tablet      capsaicin 0.075 % topical cream   2. Chronic pain syndrome G89.4 338.4    3. Moderate episode of recurrent major depressive disorder (HCC) F33.1 296.32    4. Essential hypertension I10 401.9 METABOLIC PANEL, COMPREHENSIVE      CBC WITH AUTOMATED DIFF      HEMOGLOBIN A1C WITH EAG      TSH RFX ON ABNORMAL TO FREE T4      LIPID PANEL   5. Need for hepatitis C screening test Z11.59 V73.89 HEPATITIS C AB   6. Vitamin D deficiency E55.9 268.9 VITAMIN D, 25 HYDROXY   7. Streptococcus viridans sepsis A40.8 038.0      995.91    8. Non-ST elevation MI (NSTEMI) (HCC) I21.4 410.70    9. Aneurysm (HCC) I72.9 442.9          Diagnoses and all orders for this visit:    1. Bilateral arm pain  -     CK  -     XR SHOULDER LT AP/LAT MIN 2 V; Future  -     XR SHOULDER RT AP/LAT MIN 2 V; Future  -     XR SPINE CERV 4 OR 5 V; Future  -     amitriptyline (ELAVIL) 25 mg tablet; Take 1-2 tablets at bedtime as needed for sleep and arm pain.  -     capsaicin 0.075 % topical cream; Apply  to affected area three (3) times daily. For bilateral arm pain  2. Chronic pain syndrome  She acts like a narcotic seeker.    3. Moderate episode of recurrent major depressive disorder (HCC)  Needs to follow up with Mental Health.    4. Essential hypertension  Controlled at 117/79 today.  Note: Patient left clinic without having labs drawn; states she is "scared of needles".  -     METABOLIC PANEL, COMPREHENSIVE  -     CBC WITH AUTOMATED DIFF  -     HEMOGLOBIN A1C WITH EAG  -     TSH RFX ON ABNORMAL TO FREE T4  -     LIPID PANEL    5. Need for hepatitis C screening test  -     HEPATITIS C AB    6. Vitamin D deficiency  -     VITAMIN D, 25 HYDROXY    7. Streptococcus viridans sepsis  Resolved.    8. Non-ST elevation MI (NSTEMI) (HCC)   Has not been following up with Cardiology or taking daily ASA.    9. Aneurysm (HCC), cerebral s/p repair in 10/11      Follow-up Disposition:  Return in about 1 month (around 10/16/2016), or if symptoms worsen or fail to improve, for Arm pain.      Provided patient and/or family with advanced directive information and answered pertinent questions.  Encouraged patient to provide a copy of advanced directive to the office when available.    I have discussed the diagnosis with the patient and the intended plan as seen in the above orders. Patient is in agreement.  The patient has received an after-visit summary and questions were answered concerning future plans.  I have discussed medication side effects and warnings with the patient as well.

## 2016-09-16 NOTE — ACP (Advance Care Planning) (Signed)
Advance Care Planning (ACP) Provider Conversation Snapshot    Date of ACP Conversation: 09/16/16  Persons included in Conversation:  patient  Length of ACP Conversation in minutes:  <16 minutes (Non-Billable)    Authorized Decision Maker (if patient is incapable of making informed decisions):   This person is:   Healthcare Agent/Medical Power of Attorney under Advance Directive          For Patients with Decision Making Capacity:   Values/Goals: Exploration of values, goals, and preferences if recovery is not expected, even with continued medical treatment in the event of:  Imminent death    Conversation Outcomes / Follow-Up Plan:   Recommended completion of Advance Directive form after review of ACP materials and conversation with prospective healthcare agent

## 2016-09-16 NOTE — Patient Instructions (Signed)
Pinched Nerve in the Neck: Care Instructions  Your Care Instructions  A pinched nerve in the neck happens when a vertebra or disc in the upper part of your spine is damaged. This damage can happen because of an injury. Or it can just happen with age.  The changes caused by the damage may put pressure on a nearby nerve root, pinching it. This causes symptoms such as sharp pain in your neck, shoulder, arm, hand, or back. You may also have tingling or numbness. Sometimes it makes your arm weaker. The symptoms are usually worse when you turn your head or strain your neck.  For many people, the symptoms get better over time and finally go away.  Early treatment usually includes medicines for pain and swelling. Sometimes physical therapy and special exercises may help.  Follow-up care is a key part of your treatment and safety. Be sure to make and go to all appointments, and call your doctor if you are having problems. It's also a good idea to know your test results and keep a list of the medicines you take.  How can you care for yourself at home?  ?? Be safe with medicines. Read and follow all instructions on the label.  ?? If the doctor gave you a prescription medicine for pain, take it as prescribed.  ?? If you are not taking a prescription pain medicine, ask your doctor if you can take an over-the-counter medicine.  ?? Try using a heating pad on a low or medium setting for 15 to 20 minutes every 2 or 3 hours. Try a warm shower in place of one session with the heating pad. You can also buy single-use heat wraps that last up to 8 hours.  ?? You can also try an ice pack for 10 to 15 minutes every 2 to 3 hours. There isn't strong evidence that either heat or ice will help. But you can try them to see if they help you.  ?? Don't spend too long in one position. Take short breaks to move around and change positions.  ?? Wear a seat belt and shoulder harness when you are in a car.   ?? Sleep with a pillow under your head and neck that keeps your neck straight.  ?? If you were given a neck brace (cervical collar) to limit neck motion, wear it as instructed for as many days as your doctor tells you to. Do not wear it longer than you were told to. Wearing a brace for too long can lead to neck stiffness and can weaken the neck muscles.  ?? Follow your doctor's instructions for gentle neck-stretching exercises.  ?? Do not smoke. Smoking can slow healing of your discs. If you need help quitting, talk to your doctor about stop-smoking programs and medicines. These can increase your chances of quitting for good.  ?? Avoid strenuous work or exercise until your doctor says it is okay.  When should you call for help?  Call 911 anytime you think you may need emergency care. For example, call if:  ? ?? You are unable to move an arm or a leg at all.   ?Call your doctor now or seek immediate medical care if:  ? ?? You have new or worse symptoms in your arms, legs, chest, belly, or buttocks. Symptoms may include:  ?? Numbness or tingling.  ?? Weakness.  ?? Pain.   ? ?? You lose bladder or bowel control.   ?Watch closely for changes in   your health, and be sure to contact your doctor if:  ? ?? You are not getting better as expected.   Where can you learn more?  Go to http://www.healthwise.net/[employer].  Enter K983 in the search box to learn more about "Pinched Nerve in the Neck: Care Instructions."  Current as of: August 14, 2015  Content Version: 11.4  ?? 2006-2017 [employer]. Care instructions adapted under license by [employer] (which disclaims liability or warranty for this information). If you have questions about a medical condition or this instruction, always ask your healthcare professional. [employer]wise, Inc[employer] disclaims any warranty or liability for your use of this information.

## 2016-09-16 NOTE — Progress Notes (Signed)
Dawn Colon  Identified pt with two pt identifiers(name and DOB).  Chief Complaint   Patient presents with   ??? Hospital Follow Up   ??? Arm Pain     bilateral       1. Have you been to the ER, urgent care clinic since your last visit?  Hospitalized since your last visit? Was at [facility] and seen ar [facility] 2 weeks ago for arm pain    2. Have you seen or consulted any other health care providers outside of the [facility] since your last visit?  Include any pap smears or colon screening. NO    Declines all health maintenance today.     Declines being scheduled/or having  tdap, pneumonia, pap,mammogram, pap,flu and colon/FIT.    Today's provider has been notified of reason for visit, vitals and flowsheets obtained on patients.   Patient declines information on advanced directives.    Reviewed record In preparation for visit, huddled with provider and have obtained necessary documentation      Health Maintenance Due   Topic   ??? Hepatitis C Screening    ??? Pneumococcal 19-64 Medium Risk (1 of 1 - PPSV23)   ??? DTaP/Tdap/Td series (1 - Tdap)   ??? PAP AKA CERVICAL CYTOLOGY    ??? BREAST CANCER SCRN MAMMOGRAM    ??? FOBT Q 1 YEAR AGE 74-75    ??? Influenza Age 10 to Adult        Wt Readings from Last 3 Encounters:   09/16/16 188 lb (85.3 kg)   09/03/16 194 lb (88 kg)   11/13/15 170 lb (77.1 kg)     Temp Readings from Last 3 Encounters:   09/16/16 98 ??F (36.7 ??C) (Oral)   09/03/16 97.9 ??F (36.6 ??C)   11/13/15 98.4 ??F (36.9 ??C)     BP Readings from Last 3 Encounters:   09/16/16 117/79   09/03/16 117/79   11/13/15 117/76     Pulse Readings from Last 3 Encounters:   09/16/16 66   09/03/16 100   11/13/15 82     Vitals:    09/16/16 1041   BP: 117/79   Pulse: 66   Resp: 16   Temp: 98 ??F (36.7 ??C)   TempSrc: Oral   SpO2: 98%   Weight: 188 lb (85.3 kg)   Height:  (1.626 m)   PainSc:  10 - Worst pain ever         Learning Assessment:  :     Learning Assessment 10/09/2014 11/03/2013   PRIMARY LEARNER Patient Patient    HIGHEST LEVEL OF EDUCATION - PRIMARY LEARNER  DID NOT GRADUATE HIGH SCHOOL DID NOT GRADUATE HIGH SCHOOL   BARRIERS PRIMARY LEARNER NONE -   CO-LEARNER CAREGIVER No -   PRIMARY LANGUAGE ENGLISH ENGLISH   LEARNER PREFERENCE PRIMARY DEMONSTRATION DEMONSTRATION   ANSWERED BY patient  patient   RELATIONSHIP SELF SELF       Depression Screening:  :     PHQ over the last two weeks 11/03/2013   PHQ Not Done Patient Decline   Little interest or pleasure in doing things Not at all   Feeling down, depressed or hopeless Not at all   Total Score PHQ 2 0       Fall Risk Assessment:  :     No flowsheet data found.    Abuse Screening:  :     No flowsheet data found.    ADL Screening:  :  ADL Assessment 09/16/2016   Feeding yourself No Help Needed   Getting from bed to chair No Help Needed   Getting dressed No Help Needed   Bathing or showering No Help Needed   Walk across the room (includes cane/walker) No Help Needed   Using the telphone No Help Needed   Taking your medications No Help Needed   Preparing meals Help Needed   Managing money (expenses/bills) Help Needed   Moderately strenuous housework (laundry) No Help Needed   Shopping for personal items (toiletries/medicines) No Help Needed   Shopping for groceries No Help Needed   Driving Help Needed   Climbing a flight of stairs Help Needed   Getting to places beyond walking distances Help Needed         Patient did not have blood drawn.        Medication reconciliation up to date and corrected with patient at this time.

## 2016-09-18 NOTE — Telephone Encounter (Signed)
Please advise

## 2016-09-18 NOTE — Telephone Encounter (Signed)
Tell her that patient needs to come in for labs before anything different can be prescribed for her. The blood work may help to determine what is causing her pain.  It will also let us know if her kidneys and liver are normal because Dr. Carson Myrtle cannot prescribe medications without knowing this.

## 2016-09-18 NOTE — Telephone Encounter (Signed)
Pt states was prescribed pain medication for her arm at last OV (09/16/16) but it's not helping, wants to know if something else can be called in for her (today if possible)

## 2016-09-19 NOTE — Telephone Encounter (Signed)
Attempted call to patient. Cannot reach patient on either patient numbers. Attempted call at [phone]. Left message for patient to call office.

## 2016-09-23 NOTE — Telephone Encounter (Signed)
Spoke to patient and advised her per Dr Essah.

## 2016-09-30 ENCOUNTER — Other Ambulatory Visit

## 2017-01-27 ENCOUNTER — Ambulatory Visit: Admit: 2017-01-27 | Discharge: 2017-01-27 | Payer: PRIVATE HEALTH INSURANCE | Attending: Family

## 2017-01-27 DIAGNOSIS — I10 Essential (primary) hypertension: Secondary | ICD-10-CM

## 2017-01-27 MED ORDER — AMLODIPINE 5 MG TAB
5 mg | ORAL_TABLET | Freq: Every day | ORAL | 0 refills | Status: DC
Start: 2017-01-27 — End: 2017-06-11

## 2017-01-27 MED ORDER — AMITRIPTYLINE 25 MG TAB
25 mg | ORAL_TABLET | ORAL | 1 refills | Status: DC
Start: 2017-01-27 — End: 2017-06-10

## 2017-01-27 MED ORDER — ASPIRIN 81 MG TAB, DELAYED RELEASE
81 mg | ORAL_TABLET | Freq: Every day | ORAL | 0 refills | Status: AC
Start: 2017-01-27 — End: ?

## 2017-01-27 NOTE — Progress Notes (Signed)
Chief Complaint   Patient presents with   ??? Medication Refill   ??? Arm Pain   ??? Decreased Appetite       Dawn LabradorWendy R Colon  Identified pt with two pt identifiers(name and DOB).  Chief Complaint   Patient presents with   ??? Medication Refill   ??? Arm Pain   ??? Decreased Appetite       1. Have you been to the ER, urgent care clinic since your last visit? n Hospitalized since your last visit? No    2. Have you seen or consulted any other health care providers outside of the [facility] since your last visit?n  Include any pap smears or colon screening. No    Today's provider has been notified of reason for visit, vitals and flowsheets obtained on patients.       Reviewed record In preparation for visit, huddled with provider and have obtained necessary documentation      Health Maintenance Due   Topic   ??? Hepatitis C Screening    ??? Pneumococcal 19-64 Medium Risk (1 of 1 - PPSV23)   ??? DTaP/Tdap/Td series (1 - Tdap)   ??? PAP AKA CERVICAL CYTOLOGY    ??? BREAST CANCER SCRN MAMMOGRAM    ??? FOBT Q 1 YEAR AGE 11-75    ??? Influenza Age 649 to Adult        Wt Readings from Last 3 Encounters:   01/27/17 168 lb 3.2 oz (76.3 kg)   09/16/16 188 lb (85.3 kg)   09/03/16 194 lb (88 kg)     Temp Readings from Last 3 Encounters:   01/27/17 95.4 ??F (35.2 ??C) (Oral)   09/16/16 98 ??F (36.7 ??C) (Oral)   09/03/16 97.9 ??F (36.6 ??C)     BP Readings from Last 3 Encounters:   01/27/17 121/84   09/16/16 117/79   09/03/16 117/79     Pulse Readings from Last 3 Encounters:   01/27/17 82   09/16/16 66   09/03/16 100     Vitals:    01/27/17 1144   BP: 121/84   Pulse: 82   Resp: 20   Temp: 95.4 ??F (35.2 ??C)   TempSrc: Oral   SpO2: 95%   Weight: 168 lb 3.2 oz (76.3 kg)   Height: 5\' 4"  (1.626 m)   PainSc:   2         Learning Assessment:  :     Learning Assessment 10/09/2014 11/03/2013   PRIMARY LEARNER Patient Patient   HIGHEST LEVEL OF EDUCATION - PRIMARY LEARNER  DID NOT GRADUATE HIGH SCHOOL DID NOT GRADUATE HIGH SCHOOL    BARRIERS PRIMARY LEARNER NONE -   CO-LEARNER CAREGIVER No -   PRIMARY LANGUAGE ENGLISH ENGLISH   LEARNER PREFERENCE PRIMARY DEMONSTRATION DEMONSTRATION   ANSWERED BY patient  patient   RELATIONSHIP SELF SELF       Depression Screening:  :     PHQ over the last two weeks 11/03/2013   PHQ Not Done Patient Decline   Little interest or pleasure in doing things Not at all   Feeling down, depressed, irritable, or hopeless Not at all   Total Score PHQ 2 0       Fall Risk Assessment:  :     No flowsheet data found.    Abuse Screening:  :     No flowsheet data found.    ADL Screening:  :     ADL Assessment 01/27/2017   Feeding  yourself No Help Needed   Getting from bed to chair No Help Needed   Getting dressed No Help Needed   Bathing or showering No Help Needed   Walk across the room (includes cane/walker) No Help Needed   Using the telphone No Help Needed   Taking your medications No Help Needed   Preparing meals No Help Needed   Managing money (expenses/bills) No Help Needed   Moderately strenuous housework (laundry) No Help Needed   Shopping for personal items (toiletries/medicines) No Help Needed   Shopping for groceries No Help Needed   Driving No Help Needed   Climbing a flight of stairs No Help Needed   Getting to places beyond walking distances No Help Needed                 Medication reconciliation up to date and corrected with patient at this time.

## 2017-01-27 NOTE — Progress Notes (Signed)
S: Dawn Colon is a 56 y.o. female who presents for muscle pain, HTN    Assessment/Plan:    1. Essential hypertension with goal blood pressure less than 140/90  -current therapy: 5mg  amlodipine daily + asa 81mg  (hx of aneurysm)  -BP today = 121/84, will refill HTN meds #90    2. Chronic Bilateral arm pain  -hx of SA.   -pt requesting percocet for pain but explained she would need chronic pain mgmt  -was on amitriptyline  25 mg tablet; 1-2 tablets at bedtime in past, which helped for a week, will reorder and trial again  -if amtriptyline doesn't work, can make appt to see pain mgmt for further evaluation    3. Reduced Appetite  -5-6 small meals daily;avoid eating empty calorie foods (soda, chips, etc).  -wt loss = 20lbs since 09/16/2016 OV  -consider referral to nutritionist, GI, ab scan if sx persist    RTC 2-3 months for weight check, HTN/med check         HPI:    Site of pain: left arm pains  Pain:    10 /10;  Numbness, and "just hurts, hard to describe". States only percocet works - pt states has tried "everything"  including tramadol, arm cream  Onset: 5 months - occurred after falling and lying on floor for extended period of time  Comes and goes, but "when it comes, it is bad"   ROM - no issues  Trauma - damaged nerves in arms     Reduced appetite  Has an appetite at times, but can't eat - when she eats regular food, takes a few bites and then can't eat anymore d/t nausea feeling   But doesn't vomit  BM and urination = normal  +20lb weight loss      PHQ over the last two weeks 11/03/2013   PHQ Not Done Patient Decline   Little interest or pleasure in doing things Not at all   Feeling down, depressed, irritable, or hopeless Not at all   Total Score PHQ 2 0     Social History:  Occupation: not currently, on disability   Social: lives with daughter-in-law  Nutrition: not great - "will pick up chips or something" when hungry  Breakfast: sausage/egg sandwich   Lunch: nothing, but sometimes Ensure (but expensive and can't always afford it)  Dinner: will drink Ensure  Drinking: water, soda     History   Smoking Status   ??? Current Every Day Smoker   ??? Packs/day: 0.15   ??? Years: 39.00   Smokeless Tobacco   ??? Never Used     History   Alcohol Use No     History   Drug Use   ??? Yes     Comment: cocaine/heroine-states currently using 10/09/14     Denies drug use and ETOH use currently    Review of Systems:  - Constitutional Symptoms: no fevers, chills, + 20lb weight loss  - Cardiovascular: no chest pain or palpitations  - Respiratory: no cough + shortness of breath   ROS is negative otherwise.    I reviewed the following:     Past Medical History:   Diagnosis Date   ??? 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 Chronic pain    ??? Depression     psychiatry   ??? HA (headache)    ??? Hypertension    ??? LBP (low back pain)    ??? S/P colonoscopy 11-03-13  refused       Current Outpatient Prescriptions   Medication Sig Dispense Refill   ??? amLODIPine (NORVASC) 5 mg tablet Take 1 Tab by mouth daily. CLINIC APPOINTMENT NEEDED PRIOR TO FURTHER REFILLS. 30 Tab 0   ??? aspirin delayed-release 81 mg tablet Take 1 Tab by mouth daily. CLINIC APPOINTMENT NEEDED PRIOR TO GETTING FURTHER REFILLS [phone]). 90 Tab 0   ??? amitriptyline (ELAVIL) 25 mg tablet Take 1-2 tablets at bedtime as needed for sleep and arm pain. 60 Tab 0   ??? capsaicin 0.075 % topical cream Apply  to affected area three (3) times daily. For bilateral arm pain 60 g 0       No Known Allergies     O: VS:   Visit Vitals   ??? BP 121/84 (BP 1 Location: Left arm, BP Patient Position: Sitting)   ??? Pulse 82   ??? Temp 95.4 ??F (35.2 ??C) (Oral)   ??? Resp 20   ??? Ht 5\' 4"  (1.626 m)   ??? Wt 168 lb 3.2 oz (76.3 kg)   ??? SpO2 95%   ??? BMI 28.87 kg/m2     Data reviewed:  09/16/16 ON from Dr. Carson MyrtleEssah:  ??Last seen in clinic on 10/09/14.  Did not follow up as instructed.  She has HTN, migraine headaches since having cerebral aneurysm repair on  03/25/2010 at [facility], depression, chronic pain (headaches, shoulders, elbows), polysubstance abuse (heroin, cocaine, alcohol), and noncompliance. She was hospitalized at [facility] from 07/26/16-08/09/16 after being found lying on the ground outdoors for an unknown period of time by police.  Diagnosed with Strep viridans sepsis, acute hypotension, stage 3 AKI, rhabdomyolysis, acute liver injury, bilateral UE weakness, NSTEMI due to drug abuse, polysubstance abuse, and HTN.  Had been using heroin and cocaine prior to admission.    GENERAL Dawn Colon is in no acute distress. Non-toxic. Well nourished. Well developed. Appropriately groomed.  RESP: Breath sounds are symmetrical bilaterally. Unlabored without SOB. Speaking in full sentences. Clear to auscultation bilaterally anteriorly and posteriorly.  No wheezes.  No rales or rhonchi.    CV: normal rate.  Regular rhythm. S1, S2 audible.  No murmur noted.  No rubs, clicks or gallops noted.  MUSC:  Intact x 4 extremities.    Left Shoulder: Inspection and Palpation: no deformity, muscle atrophy, erythema, edema or ecchymosis noted.    ROM: No decreased movement with Forward Flexion, Abduction, Adduction, External or Internal rotation.  No crepitus noted.  Strength: 5/5  Flexion,  Extension, External and Internal Rotation   Negative: External Rotation Resistance, Gerbers Lift Off, Hawkins, Crank Test  HEME/LYMPH: peripheral pulses palpable 2+ x 4 extremities.    Cap refill: 2+  NEURO: awake, alert and oriented to person, place, and time and event.   Sensation is intact to light touch bilaterally.    SKIN: Skin is warm and dry. Turgor is normal. No petechiae, no purpura, no rash. No cyanosis. No mottling, jaundice or pallor.  PSYCH: appropriate dress. Talkative, mild flight of ideas. Restless, plucking at face with tweezers throughout OV.  _____________________________________________________________________  Patient education was done.  Advised on nutrition, physical activity,  tobacco, alcohol and safety. Counseling included discussion of diagnosis, differentials, treatment options, prescribed treatment, warning signs and follow up. Medication risks/benefits, interactions and alternatives discussed with patient.   ??  Patient verbalized understanding and agreed to plan of care.  Patient was given an after visit summary which included current diagnoses, medications and vital signs.    Follow  up as directed

## 2017-01-27 NOTE — Patient Instructions (Signed)
1) Chronic bilateral arm pain.  Amtriptyline 25mg  1-2tabs at night should help with arm pain.  If not, referral to pain management for further evaluation and treatment    2) Refill on blood pressure medication. Please return to clinic in 2-3 months for a blood pressure check.   Please check your blood pressure through the week at staggered times in the day. (If you check in the morning, it should be at least one hour after your morning blood pressure medications.)      Arm monitors are most accurate.  If you use a wrist monitor, make sure your wrist is at heart level. You can bring your home monitor to your next visit and have it calibrated with the machine in the office to gauge your readings. Sit  with your feet uncrossed and relax for 5 minutes before taking your BP.    Keep a written record of your blood pressure readings and bring it to each appointment.  If your systolic (top) blood pressure is consistently greater than 140mmHg or less than 100mmHg of the diastolic (bottom) number is consistently greater than 90mmHg or less than 60mmHg then please schedule an office meeting.      Cardiac symptoms that would need immediate attention include: uncomfortable pressure, squeezing, fullness or pain in the center of your chest. Pain or discomfort in one or both arms, the back, neck, jaw or stomach. Shortness of breath with or without chest discomfort, breaking out in a cold sweat, nausea or lightheadedness.    3) Reduced appetite.  Make sure you are eating healthy foods.  5-6 small meals daily may be easier for you to eat and reduce feeling of nausea.  Don't eat empty calorie foods (soda, chips, etc).  Protein bars, cheese, almonds are good choices.   Healthy Weight  Body Mass Index is a noninvasive way to screen for weight and body fat.  This is a mathematical calculation based on your height and weight. A healthy BMI is between 20% -24.9%.   Your BMI is 28 %.  There is a  relationship between high BMI and various healthy problems, such as osteoarthritis, muscle pain, increased risk of cancer, diabetes, heart disease, stroke, hypertension, high cholesterol, sleep apnea, breathing problems, depression, which is why a healthy BMI is so important.    Choose lean meats for protein source which include chicken, pork, and [place]. The recommended serving size for protein is a 2-3 oz serving (the size of your fist), and 1-1.5 oz of carbohydrate per meal (about 1 cup).  Increase servings of fruits and vegetables.  Limit processed carbohydrates, (i.e. most breads, crackers, pasta, chips, rice, breaded or battered food, etc). If you choose to eat carbohydrates, whole wheat, (instead of white) is a healthier option for bread, rice, and crackers. Avoid fried foods.  Limit sugar.  Do your best to avoid all sweetened beverages, such as alcohol, juice, sodas or sweet teas, drink water, unsweet tea, diet soda, or crystal light as options instead.  (Don't drink more than 2 of the 12oz artificially sweetened drinks per day as these can increase hunger and make it more difficult to lose weight.  The daily goal for water intake should be at least 64 oz/day for most people.     Fair Life milk is a healthy choice in milk products. It is double filtered to remove much of the lactose (sugar found in milk) and recommended by trainers and nutritionists. There is 13 g of protein in a serving, so  it is an easy way to increase your protein and calcium intake.     Get 7-8 hours of sleep each night.     For reliable dietary information, go to www.[employer].fi.    You may wish to consider seeing the nutritionist at [facility] [phone]), [facility] [phone]) or [place] [phone]).            Learning About Getting More Protein  How does your body use protein?    Protein is an essential part of our diets. It is made up of chemicals  called amino acids. Your body needs protein to help build and repair muscle, skin, and other body tissues. Protein also helps fight infection, balance body fluids, and carry oxygen through the body.  How much protein do you need?  How much protein you need each day depends on your age, sex, and how active you are. It's recommended that most adults eat 5 to 7 ounces of protein foods a day. Sometimes you may need to eat more protein. Your doctor may advise you on the right amount of protein you need.  What foods contain protein?  Protein is found in a variety of foods. High-protein foods include lean meat, poultry, and fish. A serving of these foods is 2 to 3 ounces. (3 ounces is about the size and thickness of a deck of cards.)  Protein isn't just found in meat. If you are looking for other types of protein, the following foods are equal to about 1 ounce of meat:  ?? ?? cup of cooked beans, peas, or lentils  ?? ?? cup of tofu (about 2 ounces)  ?? 2 Tbsp of hummus  ?? ?? ounce of nuts or seeds (for example, 12 almonds or 7 walnut halves)  ?? 1 egg  ?? 1 Tbsp of peanut butter or other nut or seed butter  Other sources of protein include cheese, milk, and other milk products. You can also buy protein bars, drinks, and powders. Check the nutrition label for the amount of protein in each serving.  What are some tips for getting more protein?  You can get more protein in your food by adding high-protein ingredients. For example, you can:  ?? Add powdered milk to other foods, such as pudding or soups.  ?? Add powdered protein to fruit smoothies and cooked cereal.  ?? Add beans to soup and chili.  ?? Add nuts, seeds, or wheat germ to yogurt.  You can also:  ?? Spread peanut butter onto a banana.  ?? Mix cottage cheese into noodle dishes or casseroles.  ?? Sprinkle hard-boiled eggs on a salad.  ?? Grate cheese over vegetables and soups.  Where can you learn more?  Go to [employer].   Enter [phone] in the search box to learn more about "Learning About Getting More Protein."  Current as of: Oct 05, 2015  Content Version: 11.7  ?? 2006-2018 [employer]. Care instructions adapted under license by [employer] (which disclaims liability or warranty for this information). If you have questions about a medical condition or this instruction, always ask your healthcare professional. [employer] disclaims any warranty or liability for your use of this information.

## 2017-06-08 ENCOUNTER — Encounter

## 2017-06-09 NOTE — Telephone Encounter (Signed)
Patient was terminated by Dr. Carson MyrtleEssah.  She needs to get refills from her new PCP.

## 2017-06-09 NOTE — Telephone Encounter (Signed)
PCP: Christin FudgeEssah, Paulina, MD    Last appt: 01/27/2017  No future appointments.    Requested Prescriptions     Pending Prescriptions Disp Refills   ??? amitriptyline (ELAVIL) 25 mg tablet [Pharmacy Med Name: AMITRIPTYLINE HCL 25 MG TAB] 60 Tab 1     Sig: TAKE 1-2 TABLETS AT BEDTIME AS NEEDED FOR SLEEP AND ARM PAIN   ??? amLODIPine (NORVASC) 5 mg tablet [Pharmacy Med Name: AMLODIPINE BESYLATE 5 MG TAB] 90 Tab 0     Sig: take 1 tablet by mouth once daily       Prior labs and Blood pressures:  BP Readings from Last 3 Encounters:   01/27/17 121/84   09/16/16 117/79   09/03/16 117/79     No results found for: NA, K, CL, CO2, AGAP, GLU, BUN, CREA, BUCR, GFRAA, GFRNA, CA, GFRAA  No results found for: HBA1C, HGBE8, HBA1CPOC, HBA1CEXT  No results found for: CHOL, CHOLPOCT, CHOLX, CHLST, CHOLV, HDL, HDLPOC, LDL, LDLCPOC, LDLC, DLDLP, VLDLC, VLDL, TGLX, TRIGL, TRIGP, TGLPOCT, CHHD, CHHDX  No results found for: VITD3, XQVID2, XQVID3, XQVID, VD3RIA    No results found for: TSH, TSH2, TSH3, TSHP, TSHEXT

## 2017-06-10 ENCOUNTER — Encounter

## 2017-06-10 MED ORDER — AMITRIPTYLINE 25 MG TAB
25 mg | ORAL_TABLET | ORAL | 0 refills | Status: AC
Start: 2017-06-10 — End: ?

## 2017-06-11 ENCOUNTER — Encounter

## 2017-06-11 MED ORDER — AMLODIPINE 5 MG TAB
5 mg | ORAL_TABLET | ORAL | 0 refills | Status: DC
Start: 2017-06-11 — End: 2017-08-04

## 2017-07-16 ENCOUNTER — Encounter

## 2017-08-04 ENCOUNTER — Encounter

## 2017-08-04 MED ORDER — AMLODIPINE 5 MG TAB
5 mg | ORAL_TABLET | ORAL | 0 refills | Status: AC
Start: 2017-08-04 — End: ?

## 2018-07-21 ENCOUNTER — Encounter: Payer: Self-pay | Admitting: Nurse Practitioner

## 2018-07-21 ENCOUNTER — Ambulatory Visit
Admission: RE | Admit: 2018-07-21 | Discharge: 2018-07-21 | Disposition: A | Discharge status: Home / Self Care | Payer: Medicaid Other | Source: Ambulatory Visit | Attending: Nurse Practitioner | Admitting: Nurse Practitioner

## 2018-07-21 ENCOUNTER — Ambulatory Visit (HOSPITAL_BASED_OUTPATIENT_CLINIC_OR_DEPARTMENT_OTHER): Payer: Medicaid Other | Admitting: Nurse Practitioner

## 2018-07-21 VITALS — BP 143/97 | HR 72 | Temp 98.1°F | Resp 16 | Ht 65.0 in | Wt 184.0 lb

## 2018-07-21 DIAGNOSIS — M79605 Pain in left leg: Secondary | ICD-10-CM | POA: Insufficient documentation

## 2018-07-21 DIAGNOSIS — M5481 Occipital neuralgia: Secondary | ICD-10-CM | POA: Insufficient documentation

## 2018-07-21 DIAGNOSIS — G8929 Other chronic pain: Secondary | ICD-10-CM | POA: Insufficient documentation

## 2018-07-21 DIAGNOSIS — M5442 Lumbago with sciatica, left side: Secondary | ICD-10-CM

## 2018-07-21 DIAGNOSIS — Z79899 Other long term (current) drug therapy: Secondary | ICD-10-CM | POA: Insufficient documentation

## 2018-07-21 DIAGNOSIS — G894 Chronic pain syndrome: Secondary | ICD-10-CM | POA: Insufficient documentation

## 2018-07-21 DIAGNOSIS — M545 Low back pain, unspecified: Secondary | ICD-10-CM | POA: Insufficient documentation

## 2018-07-21 DIAGNOSIS — M899 Disorder of bone, unspecified: Secondary | ICD-10-CM

## 2018-07-21 DIAGNOSIS — M79604 Pain in right leg: Secondary | ICD-10-CM

## 2018-07-21 DIAGNOSIS — M533 Sacrococcygeal disorders, not elsewhere classified: Secondary | ICD-10-CM | POA: Insufficient documentation

## 2018-07-21 DIAGNOSIS — M5441 Lumbago with sciatica, right side: Secondary | ICD-10-CM

## 2018-07-21 DIAGNOSIS — Z789 Other specified health status: Secondary | ICD-10-CM

## 2018-07-21 NOTE — Patient Instructions (Addendum)
____________________________________________________________________________________________  Appointment Policy Summary  It is our goal and responsibility to provide the medical community with assistance in the evaluation and management of patients with chronic pain. Unfortunately our resources are limited. Because we do not have an unlimited amount of time, or available appointments, we are required to closely monitor and manage their use. The following rules exist to maximize their use:  Patient's responsibilities: 1. Punctuality:  At what time should I arrive? You should be physically present in our office 30 minutes before your scheduled appointment. Your scheduled appointment is with your assigned healthcare provider. However, it takes 5-10 minutes to be "checked-in", and another 15 minutes for the nurses to do the admission. If you arrive to our office at the time you were given for your appointment, you will end up being at least 20-25 minutes late to your appointment with the provider. 2. Tardiness:  What happens if I arrive only a few minutes after my scheduled appointment time? You will need to reschedule your appointment. The cutoff is your appointment time. This is why it is so important that you arrive at least 30 minutes before that appointment. If you have an appointment scheduled for 10:00 AM and you arrive at 10:01, you will be required to reschedule your appointment.  3. Plan ahead:  Always assume that you will encounter traffic on your way in. Plan for it. If you are dependent on a driver, make sure they understand these rules and the need to arrive early. 4. Other appointments and responsibilities:  Avoid scheduling any other appointments before or after your pain clinic appointments.  5. Be prepared:  Write down everything that you need to discuss with your healthcare provider and give this information to the admitting nurse. Write down the medications that you will need  refilled. Bring your pills and bottles (even the empty ones), to all of your appointments, except for those where a procedure is scheduled. 6. No children or pets:  Find someone to take care of them. It is not appropriate to bring them in. 7. Scheduling changes:  We request "advanced notification" of any changes or cancellations. 8. Advanced notification:  Defined as a time period of more than 24 hours prior to the originally scheduled appointment. This allows for the appointment to be offered to other patients. 9. Rescheduling:  When a visit is rescheduled, it will require the cancellation of the original appointment. For this reason they both fall within the category of "Cancellations".  10. Cancellations:  They require advanced notification. Any cancellation less than 24 hours before the  appointment will be recorded as a "No Show". 11. No Show:  Defined as an unkept appointment where the patient failed to notify or declare to the practice their intention or inability to keep the appointment.  Corrective process for repeat offenders:  1. Tardiness: Three (3) episodes of rescheduling due to late arrivals will be recorded as one (1) "No Show". 2. Cancellation or reschedule: Three (3) cancellations or rescheduling will be recorded as one (1) "No Show". 3. "No Shows": Three (3) "No Shows" within a 12 month period will result in discharge from the practice. ____________________________________________________________________________________________   ______________________________________________________________________________________________  Specialty Pain Scale  Introduction:  There are significant differences in how pain is reported. The word pain usually refers to physical pain, but it is also a common synonym of suffering. The medical community uses a scale from 0 (zero) to 10 (ten) to report pain level. Zero (0) is described as "no pain",   while ten (10) is described as "the worse pain  you can imagine". The problem with this scale is that physical pain is reported along with suffering. Suffering refers to mental pain, or more often yet it refers to any unpleasant feeling, emotion or aversion associated with the perception of harm or threat of harm. It is the psychological component of pain.  Pain Specialists prefer to separate the two components. The pain scale used by this practice is the Verbal Numerical Rating Scale (VNRS-11). This scale is for the physical pain only. DO NOT INCLUDE how your pain psychologically affects you. This scale is for adults 21 years of age and older. It has 11 (eleven) levels. The 1st level is 0/10. This means: "right now, I have no pain". In the context of pain management, it also means: "right now, my physical pain is under control with the current therapy".  General Information:  The scale should reflect your current level of pain. Unless you are specifically asked for the level of your worst pain, or your average pain. If you are asked for one of these two, then it should be understood that it is over the past 24 hours.  Levels 1 (one) through 5 (five) are described below, and can be treated as an outpatient. Ambulatory pain management facilities such as ours are more than adequate to treat these levels. Levels 6 (six) through 10 (ten) are also described below, however, these must be treated as a hospitalized patient. While levels 6 (six) and 7 (seven) may be evaluated at an urgent care facility, levels 8 (eight) through 10 (ten) constitute medical emergencies and as such, they belong in a hospital's emergency department. When having these levels (as described below), do not come to our office. Our facility is not equipped to manage these levels. Go directly to an urgent care facility or an emergency department to be evaluated.  Definitions:  Activities of Daily Living (ADL): Activities of daily living (ADL or ADLs) is a term used in healthcare to refer to  people's daily self-care activities. Health professionals often use a person's ability or inability to perform ADLs as a measurement of their functional status, particularly in regard to people post injury, with disabilities and the elderly. There are two ADL levels: Basic and Instrumental. Basic Activities of Daily Living (BADL  or BADLs) consist of self-care tasks that include: Bathing and showering; personal hygiene and grooming (including brushing/combing/styling hair); dressing; Toilet hygiene (getting to the toilet, cleaning oneself, and getting back up); eating and self-feeding (not including cooking or chewing and swallowing); functional mobility, often referred to as "transferring", as measured by the ability to walk, get in and out of bed, and get into and out of a chair; the broader definition (moving from one place to another while performing activities) is useful for people with different physical abilities who are still able to get around independently. Basic ADLs include the things many people do when they get up in the morning and get ready to go out of the house: get out of bed, go to the toilet, bathe, dress, groom, and eat. On the average, loss of function typically follows a particular order. Hygiene is the first to go, followed by loss of toilet use and locomotion. The last to go is the ability to eat. When there is only one remaining area in which the person is independent, there is a 62.9% chance that it is eating and only a 3.5% chance that it is hygiene. Instrumental Activities   of Daily Living (IADL or IADLs) are not necessary for fundamental functioning, but they let an individual live independently in a community. IADL consist of tasks that include: cleaning and maintaining the house; home establishment and maintenance; care of others (including selecting and supervising caregivers); care of pets; child rearing; managing money; managing financials (investments, etc.); meal preparation  and cleanup; shopping for groceries and necessities; moving within the community; safety procedures and emergency responses; health management and maintenance (taking prescribed medications); and using the telephone or other form of communication.  Instructions:  Most patients tend to report their pain as a combination of two factors, their physical pain and their psychosocial pain. This last one is also known as "suffering" and it is reflection of how physical pain affects you socially and psychologically. From now on, report them separately.  From this point on, when asked to report your pain level, report only your physical pain. Use the following table for reference.  Pain Clinic Pain Levels (0-5/10)  Pain Level Score  Description  No Pain 0   Mild pain 1 Nagging, annoying, but does not interfere with basic activities of daily living (ADL). Patients are able to eat, bathe, get dressed, toileting (being able to get on and off the toilet and perform personal hygiene functions), transfer (move in and out of bed or a chair without assistance), and maintain continence (able to control bladder and bowel functions). Blood pressure and heart rate are unaffected. A normal heart rate for a healthy adult ranges from 60 to 100 bpm (beats per minute).   Mild to moderate pain 2 Noticeable and distracting. Impossible to hide from other people. More frequent flare-ups. Still possible to adapt and function close to normal. It can be very annoying and may have occasional stronger flare-ups. With discipline, patients may get used to it and adapt.   Moderate pain 3 Interferes significantly with activities of daily living (ADL). It becomes difficult to feed, bathe, get dressed, get on and off the toilet or to perform personal hygiene functions. Difficult to get in and out of bed or a chair without assistance. Very distracting. With effort, it can be ignored when deeply involved in activities.   Moderately severe pain  4 Impossible to ignore for more than a few minutes. With effort, patients may still be able to manage work or participate in some social activities. Very difficult to concentrate. Signs of autonomic nervous system discharge are evident: dilated pupils (mydriasis); mild sweating (diaphoresis); sleep interference. Heart rate becomes elevated (>115 bpm). Diastolic blood pressure (lower number) rises above 100 mmHg. Patients find relief in laying down and not moving.   Severe pain 5 Intense and extremely unpleasant. Associated with frowning face and frequent crying. Pain overwhelms the senses.  Ability to do any activity or maintain social relationships becomes significantly limited. Conversation becomes difficult. Pacing back and forth is common, as getting into a comfortable position is nearly impossible. Pain wakes you up from deep sleep. Physical signs will be obvious: pupillary dilation; increased sweating; goosebumps; brisk reflexes; cold, clammy hands and feet; nausea, vomiting or dry heaves; loss of appetite; significant sleep disturbance with inability to fall asleep or to remain asleep. When persistent, significant weight loss is observed due to the complete loss of appetite and sleep deprivation.  Blood pressure and heart rate becomes significantly elevated. Caution: If elevated blood pressure triggers a pounding headache associated with blurred vision, then the patient should immediately seek attention at an urgent or emergency care unit, as   these may be signs of an impending stroke.    Emergency Department Pain Levels (6-10/10)  Emergency Room Pain 6 Severely limiting. Requires emergency care and should not be seen or managed at an outpatient pain management facility. Communication becomes difficult and requires great effort. Assistance to reach the emergency department may be required. Facial flushing and profuse sweating along with potentially dangerous increases in heart rate and blood pressure  will be evident.   Distressing pain 7 Self-care is very difficult. Assistance is required to transport, or use restroom. Assistance to reach the emergency department will be required. Tasks requiring coordination, such as bathing and getting dressed become very difficult.   Disabling pain 8 Self-care is no longer possible. At this level, pain is disabling. The individual is unable to do even the most "basic" activities such as walking, eating, bathing, dressing, transferring to a bed, or toileting. Fine motor skills are lost. It is difficult to think clearly.   Incapacitating pain 9 Pain becomes incapacitating. Thought processing is no longer possible. Difficult to remember your own name. Control of movement and coordination are lost.   The worst pain imaginable 10 At this level, most patients pass out from pain. When this level is reached, collapse of the autonomic nervous system occurs, leading to a sudden drop in blood pressure and heart rate. This in turn results in a temporary and dramatic drop in blood flow to the brain, leading to a loss of consciousness. Fainting is one of the body's self defense mechanisms. Passing out puts the brain in a calmed state and causes it to shut down for a while, in order to begin the healing process.    Summary: 1. Refer to this scale when providing [place] with your pain level. 2. Be accurate and careful when reporting your pain level. This will help with your care. 3. Over-reporting your pain level will lead to loss of credibility. 4. Even a level of 1/10 means that there is pain and will be treated at our facility. 5. High, inaccurate reporting will be documented as "Symptom Exaggeration", leading to loss of credibility and suspicions of possible secondary gains such as obtaining more narcotics, or wanting to appear disabled, for fraudulent reasons. 6. Only pain levels of 5 or below will be seen at our facility. 7. Pain levels of 6 and above will be sent to the  Emergency Department and the appointment cancelled. ______________________________________________________________________________________________    BMI Assessment: Estimated body mass index is 30.62 kg/m as calculated from the following:   Height as of this encounter: 5\' 5"  (1.651 m).   Weight as of this encounter: 184 lb (83.5 kg).  BMI interpretation table: BMI level Category Range association with higher incidence of chronic pain  <18 kg/m2 Underweight   18.5-24.9 kg/m2 Ideal body weight   25-29.9 kg/m2 Overweight Increased incidence by 20%  30-34.9 kg/m2 Obese (Class I) Increased incidence by 68%  35-39.9 kg/m2 Severe obesity (Class II) Increased incidence by 136%  >40 kg/m2 Extreme obesity (Class III) Increased incidence by 254%   BMI Readings from Last 4 Encounters:  07/21/18 30.62 kg/m   Wt Readings from Last 4 Encounters:  07/21/18 184 lb (83.5 kg)

## 2018-07-21 NOTE — Progress Notes (Signed)
Patient's Name: Claudia Christensen  MRN: [id number]  Referring Provider: Remi Haggard, FNP  DOB: 1960/11/16  PCP: [facility], [place] Not In  DOS: 07/21/2018  Note by: Dionisio David NP  Service setting: Ambulatory outpatient  Specialty: Interventional Pain Management  Location: [facility] (AMB) Pain Management Facility    Patient type: New Patient    Primary Reason(s) for Visit: Initial Patient Evaluation CC: Back Pain (lumbar bilateral )  HPI  [name] is a 58 y.o. year old, female patient, who comes today for an initial evaluation. She has Chronic pain syndrome; Pharmacologic therapy; Disorder of skeletal system; Problems influencing health status; Chronic bilateral low back pain with bilateral sciatica (Primary Area of Pain) (R>L); Chronic pain of both lower extremities (Secondary Area of Pain) (R>L); Chronic sacroiliac joint pain (Tertiary Area of Pain) (R>L); and Occipital neuralgia of right side (Fourth Area of Pain) on their problem list.. Her primarily concern today is the Back Pain (lumbar bilateral )  Pain Assessment: Location: Lower, Left, Right Back Radiating: occassionnaly down hips and legs to approx the knees  Onset: More than a month ago Duration: Chronic pain Quality: Sharp, Discomfort, Constant, Aching, Throbbing Severity: 10-Worst pain ever/10 (subjective, self-reported pain score)  Note: Reported level is compatible with observation. Clinically the patient looks like a 3/10 A 3/10 is viewed as "Moderate" and described as significantly interfering with activities of daily living (ADL). It becomes difficult to feed, bathe, get dressed, get on and off the toilet or to perform personal hygiene functions. Difficult to get in and out of bed or a chair without assistance. Very distracting. With effort, it can be ignored when deeply involved in activities. Information on the proper use of the pain scale provided to the patient today. When using our objective Pain Scale, levels between 6 and 10/10 are  said to belong in an emergency room, as it progressively worsens from a 6/10, described as severely limiting, requiring emergency care not usually available at an outpatient pain management facility. At a 6/10 level, communication becomes difficult and requires great effort. Assistance to reach the emergency department may be required. Facial flushing and profuse sweating along with potentially dangerous increases in heart rate and blood pressure will be evident. Effect on ADL: patient does what she needs to do but she is a little slow doing it.  Timing: Constant Modifying factors: nothing currently BP: (!) 143/97  HR: 72  Onset and Duration: Gradual Cause of pain: Unknown Severity: Getting worse, NAS-11 at its worse: 10/10, NAS-11 at its best: 10/10, NAS-11 now: 10/10 and NAS-11 on the average: 10/10 Timing: Not influenced by the time of the day Aggravating Factors: Prolonged sitting and Prolonged standing Alleviating Factors: nothing happened Associated Problems: Fatigue Quality of Pain: Aching and Throbbing Previous Examinations or Tests: The patient denies none Previous Treatments: The patient denies denies  The patient comes into the clinics today for the first time for a chronic pain management evaluation.  According to the patient her primary area of pain is in her lower back.  She denies any precipitating factors.  She feels like the pain is only been going on for couple months.  She denies any previous surgery or interventional therapy.  She will start physical therapy on tomorrow.  She did have x-rays December at outside facility.  Her second area of pain is in her legs.  She admits that the pain just goes down to the knees.  She denies one side being greater than the other.  Denies any  numbness, tingling or weakness in her legs.  Her third area of pain is in her head.  She admits that she gets an occasional right-sided sharp pain that starts at the front and goes down to the behind  her ear.   She denies any numbness or tingling. She admits that this happens approximately 2 times per week. She is status post removal of a aneurysm in 2011 at [place].  Today I took the time to provide the patient with information regarding this pain practice. The patient was informed that the practice is divided into two sections: an interventional pain management section, as well as a completely separate and distinct medication management section. I explained that there are procedure days for interventional therapies, and evaluation days for follow-ups and medication management. Because of the amount of documentation required during both, they are kept separated. This means that there is the possibility that she may be scheduled for a procedure on one day, and medication management the next. I have also informed her that because of staffing and facility limitations, this practice will no longer take patients for medication management only. To illustrate the reasons for this, I gave the patient the example of surgeons, and how inappropriate it would be to refer a patient to his/her care, just to write for the post-surgical antibiotics on a surgery done by a different surgeon.   Because interventional pain management is part of the board-certified specialty for the doctors, the patient was informed that joining this practice means that they are open to any and all interventional therapies. I made it clear that this does not mean that they will be forced to have any procedures done. What this means is that I believe interventional therapies to be essential part of the diagnosis and proper management of chronic pain conditions. Therefore, patients not interested in these interventional alternatives will be better served under the care of a different practitioner.  The patient was also made aware of my Comprehensive Pain Management Safety Guidelines where by joining this practice, they limit all of  their nerve blocks and joint injections to those done by our practice, for as long as we are retained to manage their care. Historic Controlled Substance Pharmacotherapy Review  PMP and historical list of controlled substances: Oxycodone/acetaminophen 5/325 mg, hydrocodone/acetaminophen 5/325 mg, gabapentin 100 mg, gabapentin 300 mg Highest opioid analgesic regimen found: oxycodone/acetaminophen 5/325 mg 1 to 2 tablets 4 times daily for 1 day (fill date 12/18/2017) oxycodone 10 mg/day Most recent opioid analgesic: None Current opioid analgesics: None Highest recorded MME/day: 60 mg/day MME/day: 0 mg/day Medications: The patient did not bring the medication(s) to the appointment, as requested in our "New Patient Package" Pharmacodynamics: Desired effects: Analgesia: The patient reports >50% benefit. Reported improvement in function: The patient reports medication allows her to accomplish basic ADLs. Clinically meaningful improvement in function (CMIF): Sustained CMIF goals met Perceived effectiveness: Described as relatively effective, allowing for increase in activities of daily living (ADL) Undesirable effects: Side-effects or Adverse reactions: None reported Historical Monitoring: The patient  reports no history of drug use. List of all UDS Test(s): No results found for: MDMA, COCAINSCRNUR, PCPSCRNUR, PCPQUANT, CANNABQUANT, THCU, [place] List of all Serum Drug Screening Test(s):  No results found for: AMPHSCRSER, BARBSCRSER, BENZOSCRSER, COCAINSCRSER, PCPSCRSER, PCPQUANT, THCSCRSER, CANNABQUANT, OPIATESCRSER, OXYSCRSER, PROPOXSCRSER Historical Background Evaluation: [place] PDMP: Six (6) year initial data search conducted.             [place] Department of public safety, offender search: (  Public Information) Non-contributory Risk Assessment Profile: Aberrant behavior: None observed or detected today Risk factors for fatal opioid overdose: None identified today Fatal overdose hazard ratio (HR):  Calculation deferred Non-fatal overdose hazard ratio (HR): Calculation deferred Risk of opioid abuse or dependence: 0.7-3.0% with doses ? 36 MME/day and 6.1-26% with doses ? 120 MME/day. Substance use disorder (SUD) risk level: Pending results of Medical Psychology Evaluation for SUD Opioid risk tool (ORT) (Total Score):    ORT Scoring interpretation table:  Score <3 = Low Risk for SUD  Score between 4-7 = Moderate Risk for SUD  Score >8 = High Risk for Opioid Abuse   PHQ-2 Depression Scale:  Total score:    PHQ-2 Scoring interpretation table: (Score and probability of major depressive disorder)  Score 0 = No depression  Score 1 = 15.4% Probability  Score 2 = 21.1% Probability  Score 3 = 38.4% Probability  Score 4 = 45.5% Probability  Score 5 = 56.4% Probability  Score 6 = 78.6% Probability   PHQ-9 Depression Scale:  Total score:    PHQ-9 Scoring interpretation table:  Score 0-4 = No depression  Score 5-9 = Mild depression  Score 10-14 = Moderate depression  Score 15-19 = Moderately severe depression  Score 20-27 = Severe depression (2.4 times higher risk of SUD and 2.89 times higher risk of overuse)   Pharmacologic Plan: Pending ordered tests and/or consults  Meds  The patient has a current medication list which includes the following prescription(s): lisinopril, proair hfa, and spiriva handihaler.  Current Outpatient Medications on File Prior to Visit  Medication Sig  . lisinopril (PRINIVIL,ZESTRIL) 5 MG tablet Take 5 mg by mouth daily.  Marland Kitchen PROAIR HFA 108 (90 Base) MCG/ACT inhaler Inhale 1-2 puffs into the lungs every 6 (six) hours as needed.  Marland Kitchen SPIRIVA HANDIHALER 18 MCG inhalation capsule Place 1-2 puffs into inhaler and inhale 2 (two) times daily.   No current facility-administered medications on file prior to visit.    Imaging Review    Note: No new results found.        ROS  Cardiovascular History: No reported cardiovascular signs or symptoms such as High blood  pressure, coronary artery disease, abnormal heart rate or rhythm, heart attack, blood thinner therapy or heart weakness and/or failure Pulmonary or Respiratory History: Smoking Neurological History: No reported neurological signs or symptoms such as seizures, abnormal skin sensations, urinary and/or fecal incontinence, being born with an abnormal open spine and/or a tethered spinal cord Review of Past Neurological Studies: No results found for this or any previous visit. Psychological-Psychiatric History: No reported psychological or psychiatric signs or symptoms such as difficulty sleeping, anxiety, depression, delusions or hallucinations (schizophrenial), mood swings (bipolar disorders) or suicidal ideations or attempts Gastrointestinal History: No reported gastrointestinal signs or symptoms such as vomiting or evacuating blood, reflux, heartburn, alternating episodes of diarrhea and constipation, inflamed or scarred liver, or pancreas or irrregular and/or infrequent bowel movements Genitourinary History: No reported renal or genitourinary signs or symptoms such as difficulty voiding or producing urine, peeing blood, non-functioning kidney, kidney stones, difficulty emptying the bladder, difficulty controlling the flow of urine, or chronic kidney disease Hematological History: No reported hematological signs or symptoms such as prolonged bleeding, low or poor functioning platelets, bruising or bleeding easily, hereditary bleeding problems, low energy levels due to low hemoglobin or being anemic Endocrine History: No reported endocrine signs or symptoms such as high or low blood sugar, rapid heart rate due to high thyroid levels, obesity or  weight gain due to slow thyroid or thyroid disease Rheumatologic History: No reported rheumatological signs and symptoms such as fatigue, joint pain, tenderness, swelling, redness, heat, stiffness, decreased range of motion, with or without associated  rash Musculoskeletal History: Negative for myasthenia gravis, muscular dystrophy, multiple sclerosis or malignant hyperthermia Work History: Unemployed  Allergies  Ms. Staunton has No Known Allergies.  Laboratory Chemistry  Inflammation Markers No results found for: CRP, ESRSEDRATE (CRP: Acute Phase) (ESR: Chronic Phase) Renal Function Markers No results found for: BUN, CREATININE, GFRAA, GFRNONAA Hepatic Function Markers No results found for: AST, ALT, ALBUMIN, ALKPHOS, HCVAB Electrolytes No results found for: NA, K, CL, CALCIUM, MG Neuropathy Markers No results found for: OLIDCVUD31 Bone Pathology Markers No results found for: Hendricks Milo, VD125OH2TOT, [id number], [id number], 25OHVITD1, 25OHVITD2, 25OHVITD3, CALCIUM, TESTOFREE, TESTOSTERONE Coagulation Parameters No results found for: INR, LABPROT, APTT, PLT Cardiovascular Markers No results found for: BNP, HGB, HCT Note: Lab results reviewed.  PFSH  Drug: Ms. Keener  reports no history of drug use. Alcohol:  reports no history of alcohol use. Tobacco:  reports that she has been smoking. She has never used smokeless tobacco. Medical:  has a past medical history of COPD (chronic obstructive pulmonary disease) ([place]) and Hypertension. Family: family history includes Diabetes in her mother.  Past Surgical History:  Procedure Laterality Date  . brain aneurism  2011   removed   Active Ambulatory Problems    Diagnosis Date Noted  . Chronic pain syndrome 07/21/2018  . Pharmacologic therapy 07/21/2018  . Disorder of skeletal system 07/21/2018  . Problems influencing health status 07/21/2018  . Chronic bilateral low back pain with bilateral sciatica (Primary Area of Pain) (R>L) 07/21/2018  . Chronic pain of both lower extremities (Secondary Area of Pain) (R>L) 07/21/2018  . Chronic sacroiliac joint pain [facility] Area of Pain) (R>L) 07/21/2018  . Occipital neuralgia of right side (Fourth Area of Pain) 07/21/2018   Resolved  Ambulatory Problems    Diagnosis Date Noted  . No Resolved Ambulatory Problems   Past Medical History:  Diagnosis Date  . COPD (chronic obstructive pulmonary disease) ([place])   . Hypertension    Constitutional Exam  General appearance: Well nourished, well developed, and well hydrated. In no apparent acute distress Vitals:   07/21/18 1000 07/21/18 1019  BP: (!) 156/104 (!) 143/97  Pulse: 72   Resp: 16   Temp: 98.1 F (36.7 C)   TempSrc: Oral   SpO2: 96%   Weight: 184 lb (83.5 kg)   Height: _0  (1.651 m)    BMI Assessment: Estimated body mass index is 30.62 kg/m as calculated from the following:   Height as of this encounter: _1  (1.651 m).   Weight as of this encounter: 184 lb (83.5 kg).  BMI interpretation table: BMI level Category Range association with higher incidence of chronic pain  <18 kg/m2 Underweight   18.5-24.9 kg/m2 Ideal body weight   25-29.9 kg/m2 Overweight Increased incidence by 20%  30-34.9 kg/m2 Obese (Class I) Increased incidence by 68%  35-39.9 kg/m2 Severe obesity (Class II) Increased incidence by 136%  >40 kg/m2 Extreme obesity (Class III) Increased incidence by 254%   BMI Readings from Last 4 Encounters:  07/21/18 30.62 kg/m   Wt Readings from Last 4 Encounters:  07/21/18 184 lb (83.5 kg)  Psych/Mental status: Alert, oriented x 3 (person, place, & time)       Eyes: PERLA Respiratory: No evidence of acute respiratory distress  Cervical Spine Exam  Inspection: No masses,  redness, or swelling Alignment: Symmetrical Functional ROM: Unrestricted ROM      Stability: No instability detected Muscle strength & Tone: Functionally intact Sensory: Unimpaired Palpation: No palpable anomalies              Upper Extremity (UE) Exam    Side: Right upper extremity  Side: Left upper extremity  Inspection: No masses, redness, swelling, or asymmetry. No contractures  Inspection: No masses, redness, swelling, or asymmetry. No contractures  Functional  ROM: Unrestricted ROM          Functional ROM: Unrestricted ROM          Muscle strength & Tone: Functionally intact  Muscle strength & Tone: Functionally intact  Sensory: Unimpaired  Sensory: Unimpaired  Palpation: No palpable anomalies              Palpation: No palpable anomalies              Specialized Test(s): Deferred         Specialized Test(s): Deferred          Thoracic Spine Exam  Inspection: No masses, redness, or swelling Alignment: Symmetrical Functional ROM: Unrestricted ROM Stability: No instability detected Sensory: Unimpaired Muscle strength & Tone: No palpable anomalies  Lumbar Spine Exam  Inspection: No masses, redness, or swelling Alignment: Symmetrical Functional ROM: Unrestricted ROM      Stability: No instability detected Muscle strength & Tone: Functionally intact Sensory: Unimpaired Palpation: Complains of area being tender to palpation       Provocative Tests: Lumbar Hyperextension and rotation test: Positive bilaterally for facet joint pain.  Leg raises positive less than 45 degrees bilaterally Patrick's Maneuver: Positive for bilateral S-I arthralgia              Gait & Posture Assessment  Ambulation: Unassisted Gait: Relatively normal for age and body habitus Posture: WNL   Lower Extremity Exam    Side: Right lower extremity  Side: Left lower extremity  Inspection: No masses, redness, swelling, or asymmetry. No contractures  Inspection: No masses, redness, swelling, or asymmetry. No contractures  Functional ROM: Unrestricted ROM          Functional ROM: Unrestricted ROM          Muscle strength & Tone: Normal strength (5/5)  Muscle strength & Tone: Normal strength (5/5)  Sensory: Referred pain pattern  Sensory: Referred pain pattern  Palpation: No palpable anomalies  Palpation: No palpable anomalies   Assessment  Primary Diagnosis & Pertinent Problem List: The primary encounter diagnosis was Chronic bilateral low back pain with bilateral sciatica  (Primary Area of Pain) (R>L). Diagnoses of Chronic pain of both lower extremities (Secondary Area of Pain) (R>L), Chronic sacroiliac joint pain (Tertiary Area of Pain) (R>L), Chronic pain syndrome, Pharmacologic therapy, Disorder of skeletal system, Problems influencing health status, and Occipital neuralgia of right side (Fourth Area of Pain) were also pertinent to this visit.  Visit Diagnosis: 1. Chronic bilateral low back pain with bilateral sciatica (Primary Area of Pain) (R>L)   2. Chronic pain of both lower extremities (Secondary Area of Pain) (R>L)   3. Chronic sacroiliac joint pain (Tertiary Area of Pain) (R>L)   4. Chronic pain syndrome   5. Pharmacologic therapy   6. Disorder of skeletal system   7. Problems influencing health status   8. Occipital neuralgia of right side (Fourth Area of Pain)    Plan of Care  Initial treatment plan:  Please be advised that as per protocol, today's visit has  been an evaluation only. We have not taken over the patient's controlled substance management.  Problem-specific plan: No problem-specific Assessment & Plan notes found for this encounter.  Ordered Lab-work, Procedure(s), Referral(s), & Consult(s): Orders Placed This Encounter  Procedures  . DG Si Joints  . Compliance Drug Analysis, Ur  . Comp. Metabolic Panel (12)  . Magnesium  . Vitamin B12  . Sedimentation rate  . 25-Hydroxyvitamin D Lcms D2+D3  . C-reactive protein   Pharmacotherapy: Medications ordered:  No orders of the defined types were placed in this encounter.  Medications administered during this visit: Caitlen Worth had no medications administered during this visit.   Pharmacotherapy under consideration:  Opioid Analgesics: The patient was informed that there is no guarantee that she would be a candidate for opioid analgesics. The decision will be made following CDC guidelines. This decision will be based on the results of diagnostic studies, as well as Ms. Archila's risk  profile.  Membrane stabilizer: To be determined at a later time Muscle relaxant: To be determined at a later time NSAID: To be determined at a later time Other analgesic(s): To be determined at a later time   Interventional therapies under consideration: Ms. Edrington was informed that there is no guarantee that she would be a candidate for interventional therapies. The decision will be based on the results of diagnostic studies, as well as Ms. Golembeski's risk profile.  Possible procedure(s): Diagnostic midline lumbar epidural steroid injection Diagnostic bilateral lumbar facet nerve block Possible bilateral lumbar facet radiofrequency ablation Diagnostic  bilateral sacroiliac joint injection Possible bilateral sacroiliac joint radiofrequency ablation Diagnostic right greater occipital nerve block   Provider-requested follow-up: Return for 2nd Visit, w/ Dr. Dossie Arbour.  Future Appointments  Date Time Provider [place]  07/22/2018  3:15 PM Nancy Nordmann, PT [facility] None  07/29/2018  1:45 PM Nancy Nordmann, PT [facility] None  08/04/2018 11:15 AM Nancy Nordmann, PT [facility] None  08/09/2018  7:45 AM Milinda Pointer, MD [facility] None  08/09/2018  9:45 AM Nancy Nordmann, PT [facility] None  08/11/2018  9:45 AM Nancy Nordmann, PT [facility] None    Primary Care Physician: System, Pcp Not In Location: [facility] Outpatient Pain Management Facility Note by:  Date: 07/21/2018; Time: 11:30 AM  Pain Score Disclaimer: We use the NRS-11 scale. This is a self-reported, subjective measurement of pain severity with only modest accuracy. It is used primarily to identify changes within a particular patient. It must be understood that outpatient pain scales are significantly less accurate that those used for research, where they can be applied under ideal controlled circumstances with minimal exposure to variables. In reality, the score is likely to be a combination of pain intensity and pain affect, where pain  affect describes the degree of emotional arousal or changes in action readiness caused by the sensory experience of pain. Factors such as social and work situation, setting, emotional state, anxiety levels, expectation, and prior pain experience may influence pain perception and show large inter-individual differences that may also be affected by time variables.  Patient instructions provided during this appointment: Patient Instructions   ____________________________________________________________________________________________  Appointment Policy Summary  It is our goal and responsibility to provide the medical community with assistance in the evaluation and management of patients with chronic pain. Unfortunately our resources are limited. Because we do not have an unlimited amount of time, or available appointments, we are required to closely monitor and manage their use. The following rules exist to maximize their use:  Patient's responsibilities: 1. Punctuality:  At what time should I arrive? You should be physically present in our office 30 minutes before your scheduled appointment. Your scheduled appointment is with your assigned healthcare provider. However, it takes 5-10 minutes to be "checked-in", and another 15 minutes for the nurses to do the admission. If you arrive to our office at the time you were given for your appointment, you will end up being at least 20-25 minutes late to your appointment with the provider. 2. Tardiness:  What happens if I arrive only a few minutes after my scheduled appointment time? You will need to reschedule your appointment. The cutoff is your appointment time. This is why it is so important that you arrive at least 30 minutes before that appointment. If you have an appointment scheduled for 10:00 AM and you arrive at 10:01, you will be required to reschedule your appointment.  3. Plan ahead:  Always assume that you will encounter traffic on your way in.  Plan for it. If you are dependent on a driver, make sure they understand these rules and the need to arrive early. 4. Other appointments and responsibilities:  Avoid scheduling any other appointments before or after your pain clinic appointments.  5. Be prepared:  Write down everything that you need to discuss with your healthcare provider and give this information to the admitting nurse. Write down the medications that you will need refilled. Bring your pills and bottles (even the empty ones), to all of your appointments, except for those where a procedure is scheduled. 6. No children or pets:  Find someone to take care of them. It is not appropriate to bring them in. 7. Scheduling changes:  We request "advanced notification" of any changes or cancellations. 8. Advanced notification:  Defined as a time period of more than 24 hours prior to the originally scheduled appointment. This allows for the appointment to be offered to other patients. 9. Rescheduling:  When a visit is rescheduled, it will require the cancellation of the original appointment. For this reason they both fall within the category of "Cancellations".  10. Cancellations:  They require advanced notification. Any cancellation less than 24 hours before the  appointment will be recorded as a "No Show". 11. No Show:  Defined as an unkept appointment where the patient failed to notify or declare to the practice their intention or inability to keep the appointment.  Corrective process for repeat offenders:  1. Tardiness: Three (3) episodes of rescheduling due to late arrivals will be recorded as one (1) "No Show". 2. Cancellation or reschedule: Three (3) cancellations or rescheduling will be recorded as one (1) "No Show". 3. "No Shows": Three (3) "No Shows" within a 12 month period will result in discharge from the  practice. ____________________________________________________________________________________________   ______________________________________________________________________________________________  Specialty Pain Scale  Introduction:  There are significant differences in how pain is reported. The word pain usually refers to physical pain, but it is also a common synonym of suffering. The medical community uses a scale from 0 (zero) to 10 (ten) to report pain level. Zero (0) is described as "no pain", while ten (10) is described as "the worse pain you can imagine". The problem with this scale is that physical pain is reported along with suffering. Suffering refers to mental pain, or more often yet it refers to any unpleasant feeling, emotion or aversion associated with the perception of harm or threat of harm. It is the psychological component of pain.  Pain Specialists prefer  to separate the two components. The pain scale used by this practice is the Verbal Numerical Rating Scale (VNRS-11). This scale is for the physical pain only. DO NOT INCLUDE how your pain psychologically affects you. This scale is for adults 56 years of age and older. It has 11 (eleven) levels. The 1st level is 0/10. This means: "right now, I have no pain". In the context of pain management, it also means: "right now, my physical pain is under control with the current therapy".  General Information:  The scale should reflect your current level of pain. Unless you are specifically asked for the level of your worst pain, or your average pain. If you are asked for one of these two, then it should be understood that it is over the past 24 hours.  Levels 1 (one) through 5 (five) are described below, and can be treated as an outpatient. Ambulatory pain management facilities such as ours are more than adequate to treat these levels. Levels 6 (six) through 10 (ten) are also described below, however, these must be treated as a  hospitalized patient. While levels 6 (six) and 7 (seven) may be evaluated at an urgent care facility, levels 8 (eight) through 10 (ten) constitute medical emergencies and as such, they belong in a hospital's emergency department. When having these levels (as described below), do not come to our office. Our facility is not equipped to manage these levels. Go directly to an urgent care facility or an emergency department to be evaluated.  Definitions:  Activities of Daily Living (ADL): Activities of daily living (ADL or ADLs) is a term used in healthcare to refer to people's daily self-care activities. Health professionals often use a person's ability or inability to perform ADLs as a measurement of their functional status, particularly in regard to people post injury, with disabilities and the elderly. There are two ADL levels: Basic and Instrumental. Basic Activities of Daily Living (BADL  or BADLs) consist of self-care tasks that include: Bathing and showering; personal hygiene and grooming (including brushing/combing/styling hair); dressing; Toilet hygiene (getting to the toilet, cleaning oneself, and getting back up); eating and self-feeding (not including cooking or chewing and swallowing); functional mobility, often referred to as "transferring", as measured by the ability to walk, get in and out of bed, and get into and out of a chair; the broader definition (moving from one place to another while performing activities) is useful for people with different physical abilities who are still able to get around independently. Basic ADLs include the things many people do when they get up in the morning and get ready to go out of the house: get out of bed, go to the toilet, bathe, dress, groom, and eat. On the average, loss of function typically follows a particular order. Hygiene is the first to go, followed by loss of toilet use and locomotion. The last to go is the ability to eat. When there is only one  remaining area in which the person is independent, there is a 62.9% chance that it is eating and only a 3.5% chance that it is hygiene. Instrumental Activities of Daily Living (IADL or IADLs) are not necessary for fundamental functioning, but they let an individual live independently in a community. IADL consist of tasks that include: cleaning and maintaining the house; home establishment and maintenance; care of others (including selecting and supervising caregivers); care of pets; child rearing; managing money; managing financials (investments, etc.); meal preparation and cleanup; shopping for groceries and necessities;  moving within the community; safety procedures and emergency responses; health management and maintenance (taking prescribed medications); and using the telephone or other form of communication.  Instructions:  Most patients tend to report their pain as a combination of two factors, their physical pain and their psychosocial pain. This last one is also known as "suffering" and it is reflection of how physical pain affects you socially and psychologically. From now on, report them separately.  From this point on, when asked to report your pain level, report only your physical pain. Use the following table for reference.  Pain Clinic Pain Levels (0-5/10)  Pain Level Score  Description  No Pain 0   Mild pain 1 Nagging, annoying, but does not interfere with basic activities of daily living (ADL). Patients are able to eat, bathe, get dressed, toileting (being able to get on and off the toilet and perform personal hygiene functions), transfer (move in and out of bed or a chair without assistance), and maintain continence (able to control bladder and bowel functions). Blood pressure and heart rate are unaffected. A normal heart rate for a healthy adult ranges from 60 to 100 bpm (beats per minute).   Mild to moderate pain 2 Noticeable and distracting. Impossible to hide from other people. More  frequent flare-ups. Still possible to adapt and function close to normal. It can be very annoying and may have occasional stronger flare-ups. With discipline, patients may get used to it and adapt.   Moderate pain 3 Interferes significantly with activities of daily living (ADL). It becomes difficult to feed, bathe, get dressed, get on and off the toilet or to perform personal hygiene functions. Difficult to get in and out of bed or a chair without assistance. Very distracting. With effort, it can be ignored when deeply involved in activities.   Moderately severe pain 4 Impossible to ignore for more than a few minutes. With effort, patients may still be able to manage work or participate in some social activities. Very difficult to concentrate. Signs of autonomic nervous system discharge are evident: dilated pupils (mydriasis); mild sweating (diaphoresis); sleep interference. Heart rate becomes elevated (>115 bpm). Diastolic blood pressure (lower number) rises above 100 mmHg. Patients find relief in laying down and not moving.   Severe pain 5 Intense and extremely unpleasant. Associated with frowning face and frequent crying. Pain overwhelms the senses.  Ability to do any activity or maintain social relationships becomes significantly limited. Conversation becomes difficult. Pacing back and forth is common, as getting into a comfortable position is nearly impossible. Pain wakes you up from deep sleep. Physical signs will be obvious: pupillary dilation; increased sweating; goosebumps; brisk reflexes; cold, clammy hands and feet; nausea, vomiting or dry heaves; loss of appetite; significant sleep disturbance with inability to fall asleep or to remain asleep. When persistent, significant weight loss is observed due to the complete loss of appetite and sleep deprivation.  Blood pressure and heart rate becomes significantly elevated. Caution: If elevated blood pressure triggers a pounding headache associated with  blurred vision, then the patient should immediately seek attention at an urgent or emergency care unit, as these may be signs of an impending stroke.    Emergency Department Pain Levels (6-10/10)  Emergency Room Pain 6 Severely limiting. Requires emergency care and should not be seen or managed at an outpatient pain management facility. Communication becomes difficult and requires great effort. Assistance to reach the emergency department may be required. Facial flushing and profuse sweating along with potentially dangerous increases  in heart rate and blood pressure will be evident.   Distressing pain 7 Self-care is very difficult. Assistance is required to transport, or use restroom. Assistance to reach the emergency department will be required. Tasks requiring coordination, such as bathing and getting dressed become very difficult.   Disabling pain 8 Self-care is no longer possible. At this level, pain is disabling. The individual is unable to do even the most "basic" activities such as walking, eating, bathing, dressing, transferring to a bed, or toileting. Fine motor skills are lost. It is difficult to think clearly.   Incapacitating pain 9 Pain becomes incapacitating. Thought processing is no longer possible. Difficult to remember your own name. Control of movement and coordination are lost.   The worst pain imaginable 10 At this level, most patients pass out from pain. When this level is reached, collapse of the autonomic nervous system occurs, leading to a sudden drop in blood pressure and heart rate. This in turn results in a temporary and dramatic drop in blood flow to the brain, leading to a loss of consciousness. Fainting is one of the body's self defense mechanisms. Passing out puts the brain in a calmed state and causes it to shut down for a while, in order to begin the healing process.    Summary: 1. Refer to this scale when providing [place] with your pain level. 2. Be accurate and careful  when reporting your pain level. This will help with your care. 3. Over-reporting your pain level will lead to loss of credibility. 4. Even a level of 1/10 means that there is pain and will be treated at our facility. 5. High, inaccurate reporting will be documented as "Symptom Exaggeration", leading to loss of credibility and suspicions of possible secondary gains such as obtaining more narcotics, or wanting to appear disabled, for fraudulent reasons. 6. Only pain levels of 5 or below will be seen at our facility. 7. Pain levels of 6 and above will be sent to the Emergency Department and the appointment cancelled. ______________________________________________________________________________________________    BMI Assessment: Estimated body mass index is 30.62 kg/m as calculated from the following:   Height as of this encounter: _0  (1.651 m).   Weight as of this encounter: 184 lb (83.5 kg).  BMI interpretation table: BMI level Category Range association with higher incidence of chronic pain  <18 kg/m2 Underweight   18.5-24.9 kg/m2 Ideal body weight   25-29.9 kg/m2 Overweight Increased incidence by 20%  30-34.9 kg/m2 Obese (Class I) Increased incidence by 68%  35-39.9 kg/m2 Severe obesity (Class II) Increased incidence by 136%  >40 kg/m2 Extreme obesity (Class III) Increased incidence by 254%   BMI Readings from Last 4 Encounters:  07/21/18 30.62 kg/m   Wt Readings from Last 4 Encounters:  07/21/18 184 lb (83.5 kg)

## 2018-07-21 NOTE — Progress Notes (Signed)
Safety precautions to be maintained throughout the outpatient stay will include: orient to surroundings, keep bed in low position, maintain call bell within reach at all times, provide assistance with transfer out of bed and ambulation.  

## 2018-07-22 ENCOUNTER — Ambulatory Visit: Payer: Medicaid Other | Attending: Family Medicine | Admitting: Physical Therapy

## 2018-07-24 LAB — COMP. METABOLIC PANEL (12)
AST: 19 IU/L (ref 0–40)
Albumin/Globulin Ratio: 1.4 (ref 1.2–2.2)
Albumin: 4.4 g/dL (ref 3.8–4.9)
Alkaline Phosphatase: 92 IU/L (ref 39–117)
BUN/Creatinine Ratio: 20 (ref 9–23)
BUN: 16 mg/dL (ref 6–24)
Bilirubin Total: 0.3 mg/dL (ref 0.0–1.2)
Calcium: 9.9 mg/dL (ref 8.7–10.2)
Chloride: 106 mmol/L (ref 96–106)
Creatinine, Ser: 0.81 mg/dL (ref 0.57–1.00)
GFR calc Af Amer: 92 mL/min/{1.73_m2} (ref >59)
GFR calc non Af Amer: 80 mL/min/{1.73_m2} (ref >59)
Globulin, Total: 3.2 g/dL (ref 1.5–4.5)
Glucose: 101 mg/dL — ABNORMAL HIGH (ref 65–99)
Potassium: 4.7 mmol/L (ref 3.5–5.2)
Sodium: 140 mmol/L (ref 134–144)
Total Protein: 7.6 g/dL (ref 6.0–8.5)

## 2018-07-24 LAB — SEDIMENTATION RATE: Sed Rate: 60 mm/hr — ABNORMAL HIGH (ref 0–40)

## 2018-07-24 LAB — VITAMIN B12: Vitamin B-12: 388 pg/mL (ref 232–1245)

## 2018-07-24 LAB — 25-HYDROXY VITAMIN D LCMS D2+D3
25-Hydroxy, Vitamin D-3: 9.9 ng/mL
25-Hydroxy, Vitamin D: 10 ng/mL — ABNORMAL LOW

## 2018-07-24 LAB — 25-HYDROXYVITAMIN D LCMS D2+D3

## 2018-07-24 LAB — MAGNESIUM: MAGNESIUM: 2 mg/dL (ref 1.6–2.3)

## 2018-07-24 LAB — C-REACTIVE PROTEIN: CRP: 2 mg/L (ref 0–10)

## 2018-07-25 LAB — COMPLIANCE DRUG ANALYSIS, UR

## 2018-07-29 ENCOUNTER — Ambulatory Visit: Payer: Medicaid Other | Admitting: Physical Therapy

## 2018-08-04 ENCOUNTER — Encounter: Payer: Medicaid Other | Admitting: Physical Therapy

## 2018-08-09 ENCOUNTER — Encounter: Payer: Medicaid Other | Admitting: Physical Therapy

## 2018-08-09 ENCOUNTER — Ambulatory Visit: Payer: Medicaid Other | Admitting: Pain Medicine

## 2018-08-11 ENCOUNTER — Encounter: Payer: Medicaid Other | Admitting: Physical Therapy

## 2018-08-23 ENCOUNTER — Ambulatory Visit: Payer: Medicaid Other | Attending: Pain Medicine | Admitting: Pain Medicine

## 2018-08-23 ENCOUNTER — Other Ambulatory Visit: Payer: Self-pay

## 2018-08-23 DIAGNOSIS — M79605 Pain in left leg: Secondary | ICD-10-CM

## 2018-08-23 DIAGNOSIS — G8929 Other chronic pain: Secondary | ICD-10-CM

## 2018-08-23 DIAGNOSIS — M5136 Other intervertebral disc degeneration, lumbar region: Secondary | ICD-10-CM

## 2018-08-23 DIAGNOSIS — M79604 Pain in right leg: Secondary | ICD-10-CM | POA: Diagnosis not present

## 2018-08-23 DIAGNOSIS — G894 Chronic pain syndrome: Secondary | ICD-10-CM | POA: Diagnosis not present

## 2018-08-23 DIAGNOSIS — E559 Vitamin D deficiency, unspecified: Secondary | ICD-10-CM | POA: Insufficient documentation

## 2018-08-23 DIAGNOSIS — R51 Headache: Secondary | ICD-10-CM

## 2018-08-23 DIAGNOSIS — M545 Low back pain: Secondary | ICD-10-CM

## 2018-08-23 DIAGNOSIS — M899 Disorder of bone, unspecified: Secondary | ICD-10-CM

## 2018-08-23 DIAGNOSIS — R519 Headache, unspecified: Secondary | ICD-10-CM | POA: Insufficient documentation

## 2018-08-23 DIAGNOSIS — M533 Sacrococcygeal disorders, not elsewhere classified: Secondary | ICD-10-CM

## 2018-08-23 MED ORDER — MELOXICAM 15 MG PO TABS: 15.0000 mg | ORAL_TABLET | Freq: Every day | ORAL | 1 refills | Status: DC

## 2018-08-23 MED ORDER — VITAMIN D3 125 MCG (5000 UT) PO CAPS: 1.0000 | ORAL_CAPSULE | Freq: Every day | ORAL | 5 refills | Status: AC

## 2018-08-23 MED ORDER — ERGOCALCIFEROL 1.25 MG (50000 UT) PO CAPS: 50000.0000 [IU] | ORAL_CAPSULE | ORAL | 0 refills | Status: AC

## 2018-08-23 MED ORDER — MAGNESIUM 500 MG PO CAPS: 500.0000 mg | ORAL_CAPSULE | Freq: Two times a day (BID) | ORAL | 5 refills | Status: AC

## 2018-08-23 MED ORDER — CALCIUM CARBONATE 600 MG PO TABS: 600.0000 mg | ORAL_TABLET | Freq: Two times a day (BID) | ORAL | 0 refills | Status: AC

## 2018-08-23 NOTE — Progress Notes (Addendum)
Patient's Name: Claudia Christensen  MRN: [id number]  Referring Provider: No ref. provider found  DOB: 12-May-1961  PCP: System, Pcp Not In  DOS: 08/23/2018  Note by: Gaspar Cola, MD  Service setting: Virtual Visit (Telephone)  Attending: Gaspar Cola, MD  Location: Telephone Encounter  Specialty: Interventional Pain Management  Patient type: Established   Pain Management Encounter Note - Virtual Visit via Telephone Telehealth (real-time audio visits between healthcare provider and patient).  Patient's Phone No.:  [phone] (home); [phone] (mobile); (Preferred) [phone]  Pre-screening note:  Our staff contacted Claudia Christensen and offered her an "in person", "face-to-face" appointment versus a telephone encounter. She indicated preferring the telephone encounter, at this time.   Primary Reason(s) for Virtual Visit: Encounter for evaluation before starting new chronic pain management plan of care (Level of risk: moderate) COVID-19*  Social distancing based on CDC ans AMA recommendations.   I contacted Claudia Christensen on 08/23/2018 at 9:37 AM by telephone and clearly identified myself as Gaspar Cola, MD. I verified that I was speaking with the correct person using two identifiers (Name and date of birth: June 14, 1960).  Advanced Informed Consent I sought verbal advanced consent from Claudia Christensen for telemedicine interactions and virtual visit. I informed Claudia Christensen of the security and privacy concerns, risks, and limitations associated with performing an evaluation and management service by telephone. I also informed Claudia Christensen of the availability of "in person" appointments and I informed her of the possibility of a patient responsible charge related to this service. Claudia Christensen expressed understanding and agreed to proceed.   Historic Elements   Claudia Christensen is a 58 y.o. year old, female patient evaluated today after her last encounter by our practice on 07/21/2018. Claudia Christensen  has a past  medical history of COPD (chronic obstructive pulmonary disease) ([place]) and Hypertension. She also  has a past surgical history that includes brain aneurism (2011). Claudia Christensen has a current medication list which includes the following prescription(s): calcium carbonate, vitamin d3, ergocalciferol, lisinopril, magnesium, meloxicam, proair hfa, and spiriva handihaler. She  reports that she has been smoking. She has never used smokeless tobacco. She reports that she does not drink alcohol or use drugs. Claudia Christensen has No Known Allergies.   HPI  I last saw her on Visit date not found. She is being evaluated for review of studies ordered on initial visit and to consider treatment plan options. Today I went over the results of her tests. These were explained in "Layman's terms". During today's appointment I went over my diagnostic impression, as well as the proposed treatment plan.  According to the patient her primary area of pain is in her lower back (R>L).  She denies any precipitating factors.  She feels like the pain is only been going on for couple months.  She denies any previous surgery or interventional therapy.  She was pending physical therapy.  She did have x-rays December at outside facility.  Her second area of pain is in her legs.  She admits that the pain just goes down to the knees.  She denies one side being greater than the other.  Denies any numbness, tingling or weakness in her legs.  Her third area of pain is in her head.  She admits that she gets an occasional right-sided sharp pain that starts at the front and goes down to the behind her ear.   She denies any numbness or tingling. She admits that this happens approximately 2 times per  week. She is status post removal of a aneurysm in 2011 at [facility].  In considering the treatment plan options, Claudia Christensen was reminded that I no longer take patients for medication management only. I asked her to let me know if she had no intention of  taking advantage of the interventional therapies, so that we could make arrangements to provide this space to someone interested. I also made it clear that undergoing interventional therapies for the purpose of getting pain medications is very inappropriate on the part of a patient, and it will not be tolerated in this practice. This type of behavior would suggest true addiction and therefore it requires referral to an addiction specialist.   I discussed the assessment and treatment plan with the patient. The patient was provided an opportunity to ask questions and all were answered. The patient agreed with the plan and demonstrated an understanding of the instructions.  Patient advised to call back or seek an in-person evaluation if the symptoms or condition worsens.  Controlled Substance Pharmacotherapy Assessment REMS (Risk Evaluation and Mitigation Strategy)  Analgesic: None Highest recorded MME/day: 60 mg/day MME/day: 0 mg/day   Monitoring: [place] PMP: PDMP reviewed during this encounter. Not applicable at this point since we have not taken over the patient's medication management yet. List of other Serum/Urine Drug Screening Test(s):  No results found. List of all UDS test(s) done:  Lab Results  Component Value Date   SUMMARY FINAL 07/21/2018   Last UDS on record: Summary  Date Value Ref Range Status  07/21/2018 FINAL  Final    Comment:    ==================================================================== TOXASSURE COMP DRUG ANALYSIS,UR ==================================================================== Test                             Result       Flag       Units Drug Present   Doxylamine                     PRESENT   Dextromethorphan               PRESENT   Dextrorphan/Levorphanol        PRESENT    Dextrorphan is an expected metabolite of dextromethorphan, an    over-the-counter or prescription cough suppressant. Dextrorphan    cannot be distinguished from the scheduled  prescription    medication levorphanol by the method used for analysis. ==================================================================== Test                      Result    Flag   Units      Ref Range   Creatinine              66               mg/dL      >=20 ==================================================================== Declared Medications:  Medication list was not provided. ==================================================================== For clinical consultation, please call [phone]. ====================================================================    UDS interpretation: No unexpected findings.          Medication Assessment Form: Not applicable. Treatment compliance: Not applicable Risk Assessment Profile: Aberrant behavior: See initial evaluations. None observed or detected today Comorbid factors increasing risk of overdose: See initial evaluation. No additional risks detected today Opioid risk tool (ORT):  No flowsheet data found.  ORT Scoring interpretation table:  Score <3 = Low Risk for SUD  Score between 4-7 = Moderate Risk  for SUD  Score >8 = High Risk for Opioid Abuse   Risk of substance use disorder (SUD): Low  Risk Mitigation Strategies:  Patient opioid safety counseling: No controlled substances prescribed. Patient-Prescriber Agreement (PPA): No agreement signed.  Controlled substance notification to other providers: None required. No opioid therapy.  Pharmacologic Plan: Non-opioid analgesic therapy offered.             Meds   Current Outpatient Medications:  .  calcium carbonate (CALCIUM 600) 600 MG TABS tablet, Take 1 tablet (600 mg total) by mouth 2 (two) times daily with a meal for 30 days., Disp: 60 tablet, Rfl: 0 .  Cholecalciferol (VITAMIN D3) 125 MCG (5000 UT) CAPS, Take 1 capsule (5,000 Units total) by mouth daily with breakfast. Take along with calcium and magnesium., Disp: 30 capsule, Rfl: 5 .  ergocalciferol (VITAMIN D2)  1.25 MG (50000 UT) capsule, Take 1 capsule (50,000 Units total) by mouth 2 (two) times a week. X 6 weeks., Disp: 12 capsule, Rfl: 0 .  lisinopril (PRINIVIL,ZESTRIL) 5 MG tablet, Take 5 mg by mouth daily., Disp: , Rfl:  .  Magnesium 500 MG CAPS, Take 1 capsule (500 mg total) by mouth 2 (two) times daily at 8 am and 10 pm., Disp: 60 capsule, Rfl: 5 .  meloxicam (MOBIC) 15 MG tablet, Take 1 tablet (15 mg total) by mouth daily., Disp: 30 tablet, Rfl: 1 .  PROAIR HFA 108 (90 Base) MCG/ACT inhaler, Inhale 1-2 puffs into the lungs every 6 (six) hours as needed., Disp: , Rfl:  .  SPIRIVA HANDIHALER 18 MCG inhalation capsule, Place 1-2 puffs into inhaler and inhale 2 (two) times daily., Disp: , Rfl:   Laboratory Chemistry  Inflammation Markers (CRP: Acute Phase) (ESR: Chronic Phase) Lab Results  Component Value Date   CRP 2 07/21/2018   ESRSEDRATE 60 (H) 07/21/2018                         Rheumatology Markers No results found.  Renal Function Markers Lab Results  Component Value Date   BUN 16 07/21/2018   CREATININE 0.81 07/21/2018   BCR 20 07/21/2018   GFRAA 92 07/21/2018   GFRNONAA 80 07/21/2018                             Hepatic Function Markers Lab Results  Component Value Date   AST 19 07/21/2018   ALBUMIN 4.4 07/21/2018   ALKPHOS 92 07/21/2018                        Electrolytes Lab Results  Component Value Date   NA 140 07/21/2018   K 4.7 07/21/2018   CL 106 07/21/2018   CALCIUM 9.9 07/21/2018   MG 2.0 07/21/2018                        Neuropathy Markers Lab Results  Component Value Date   VITAMINB12 388 07/21/2018                        CNS Tests No results found.  Bone Pathology Markers Lab Results  Component Value Date   25OHVITD1 10 (L) 07/21/2018   25OHVITD2 <1.0 07/21/2018   25OHVITD3 9.9 07/21/2018  Coagulation Parameters No results found.  Cardiovascular Markers No results found.  CA Markers No results  found.  Endocrine Markers No results found.  Note: Lab results reviewed.  Recent Diagnostic Imaging Review  Sacroiliac Joint Imaging: Sacroiliac Joint DG:  Results for orders placed during the hospital encounter of 07/21/18  DG Si Joints   Narrative CLINICAL DATA:  Pain across entire sacral area without injury  EXAM: BILATERAL SACROILIAC JOINTS - 3+ VIEW  COMPARISON:  None  FINDINGS: SI joints symmetric and preserved.  Osseous mineralization normal.  Sacral foramina symmetric.  No fracture or bone destruction.  Hip joint spaces symmetric.  Atherosclerotic calcifications of common iliac arteries.  IMPRESSION: No sacral or SI joint abnormalities identified.   Electronically Signed   By: Lavonia Dana M.D.   On: 07/21/2018 17:16    Complexity Note: Imaging results reviewed. Results shared with Claudia Christensen, using Layman's terms.                         Assessment  The primary encounter diagnosis was Chronic pain syndrome. Diagnoses of Chronic low back pain (Primary area of Pain) (Bilateral) (R>L) w/o sciatica, DDD (degenerative disc disease), lumbar (L2-3, L4-5), Chronic lower extremity pain (Secondary Area of Pain) (Bilateral) (R>L), Chronic sacroiliac joint pain (Tertiary Area of Pain) (Bilateral) (R>L), Temporal and frontal headaches (intermittent) (Right), Disorder of skeletal system, and Vitamin D deficiency were also pertinent to this visit.  Plan of Care  Pharmacotherapy (Medications Ordered): Meds ordered this encounter  Medications  . ergocalciferol (VITAMIN D2) 1.25 MG (50000 UT) capsule    Sig: Take 1 capsule (50,000 Units total) by mouth 2 (two) times a week. X 6 weeks.    Dispense:  12 capsule    Refill:  0    Do not add this medication to the electronic "Automatic Refill" notification system. Patient may have prescription filled one day early if pharmacy is closed on scheduled refill date.  . Cholecalciferol (VITAMIN D3) 125 MCG (5000 UT) CAPS    Sig:  Take 1 capsule (5,000 Units total) by mouth daily with breakfast. Take along with calcium and magnesium.    Dispense:  30 capsule    Refill:  5    May substitute with similar over-the-counter product.  . Magnesium 500 MG CAPS    Sig: Take 1 capsule (500 mg total) by mouth 2 (two) times daily at 8 am and 10 pm.    Dispense:  60 capsule    Refill:  5    May substitute with similar over-the-counter product.  . calcium carbonate (CALCIUM 600) 600 MG TABS tablet    Sig: Take 1 tablet (600 mg total) by mouth 2 (two) times daily with a meal for 30 days.    Dispense:  60 tablet    Refill:  0    May substitute with similar over-the-counter product.  . meloxicam (MOBIC) 15 MG tablet    Sig: Take 1 tablet (15 mg total) by mouth daily.    Dispense:  30 tablet    Refill:  1    Do not add this medication to the electronic "Automatic Refill" notification system. Patient may have prescription filled one day early if pharmacy is closed on scheduled refill date.    Procedure Orders     Lumbar Epidural Injection Lab Orders  No laboratory test(s) ordered today   Imaging Orders  No imaging studies ordered today   Referral Orders  No referral(s) requested today  Pharmacological management options:  Opioid Analgesics: I will not be prescribing any opioids at this time Membrane stabilizer: We have discussed the possibility of optimizing this mode of therapy, if tolerated Muscle relaxant: We have discussed the possibility of a trial NSAID: The patient indicates that she has taken some meloxicam in the past.  I have offered her to give her a refill on it, at least until she can decide whether or not she is interested in her LESI. Other analgesic(s): To be determined at a later time   Interventional management options: Planned, scheduled, and/or pending:    The patient indicates being scared of needles and therefore she wants to think about lumbar epidural steroid injection that we have offered her.   Meanwhile, I have provided her with a prescription for her vitamin D deficiency.   Considering:   Diagnostic midline L2-3 LESI #1  Diagnostic bilateral lumbar facet nerve block  Possible bilateral lumbar facet RFA  Diagnostic  bilateral sacroiliac joint injection  Possible bilateral sacroiliac joint RFA  Diagnostic right greater occipital nerve block    PRN Procedures:   None at this time   Total duration of non-face-to-face encounter: 25 minutes.  Provider-requested follow-up: Return for PRN Procedure(s): (ML) L2-3 LESI #1.  No future appointments.  Primary Care Physician: System, Pcp Not In Location: [facility] Note by: Gaspar Cola, MD Date: 08/23/2018; Time: 9:37 AM

## 2018-09-20 ENCOUNTER — Telehealth: Payer: Self-pay | Admitting: Pain Medicine

## 2018-09-20 NOTE — Telephone Encounter (Signed)
Patient states she is still in a lot of pain and the meds prescribed to her have not helped her pain. Wants to know what to do about this. Currently waiting on a procedure appt.

## 2018-09-20 NOTE — Telephone Encounter (Signed)
Spoke with patient, told her that currently we aren't able to do anything different and there is an option that she can go to the ED.  I will run this by Dr Laban Emperor that she is having severe back pain just to make sure.

## 2019-12-31 ENCOUNTER — Emergency Department
Admission: EM | Admit: 2019-12-31 | Discharge: 2019-12-31 | Disposition: A | Discharge status: Home / Self Care | Payer: Medicaid Other | Attending: Emergency Medicine | Admitting: Emergency Medicine

## 2019-12-31 ENCOUNTER — Emergency Department: Payer: Medicaid Other

## 2019-12-31 ENCOUNTER — Other Ambulatory Visit: Payer: Self-pay

## 2019-12-31 DIAGNOSIS — I10 Essential (primary) hypertension: Secondary | ICD-10-CM | POA: Diagnosis not present

## 2019-12-31 DIAGNOSIS — F17200 Nicotine dependence, unspecified, uncomplicated: Secondary | ICD-10-CM | POA: Diagnosis not present

## 2019-12-31 DIAGNOSIS — F1910 Other psychoactive substance abuse, uncomplicated: Secondary | ICD-10-CM | POA: Insufficient documentation

## 2019-12-31 DIAGNOSIS — R451 Restlessness and agitation: Secondary | ICD-10-CM | POA: Insufficient documentation

## 2019-12-31 DIAGNOSIS — R0603 Acute respiratory distress: Secondary | ICD-10-CM | POA: Insufficient documentation

## 2019-12-31 DIAGNOSIS — Z20822 Contact with and (suspected) exposure to covid-19: Secondary | ICD-10-CM | POA: Insufficient documentation

## 2019-12-31 DIAGNOSIS — N179 Acute kidney failure, unspecified: Secondary | ICD-10-CM | POA: Diagnosis not present

## 2019-12-31 DIAGNOSIS — J449 Chronic obstructive pulmonary disease, unspecified: Secondary | ICD-10-CM | POA: Insufficient documentation

## 2019-12-31 DIAGNOSIS — R0789 Other chest pain: Secondary | ICD-10-CM | POA: Insufficient documentation

## 2019-12-31 DIAGNOSIS — T401X1A Poisoning by heroin, accidental (unintentional), initial encounter: Secondary | ICD-10-CM | POA: Diagnosis present

## 2019-12-31 DIAGNOSIS — Z79899 Other long term (current) drug therapy: Secondary | ICD-10-CM | POA: Insufficient documentation

## 2019-12-31 LAB — COMPREHENSIVE METABOLIC PANEL
ALT: 85 U/L — ABNORMAL HIGH (ref 0–44)
ALT: 65 U/L — ABNORMAL HIGH (ref 0–44)
AST: 125 U/L — ABNORMAL HIGH (ref 15–41)
AST: 96 U/L — ABNORMAL HIGH (ref 15–41)
Albumin: 5 g/dL (ref 3.5–5.0)
Albumin: 3.8 g/dL (ref 3.5–5.0)
Alkaline Phosphatase: 77 U/L (ref 38–126)
Alkaline Phosphatase: 59 U/L (ref 38–126)
Anion gap: 14 (ref 5–15)
Anion gap: 13 (ref 5–15)
BUN: 39 mg/dL — ABNORMAL HIGH (ref 6–20)
BUN: 30 mg/dL — ABNORMAL HIGH (ref 6–20)
CO2: 31 mmol/L (ref 22–32)
CO2: 29 mmol/L (ref 22–32)
Calcium: 10.1 mg/dL (ref 8.9–10.3)
Calcium: 8.9 mg/dL (ref 8.9–10.3)
Chloride: 96 mmol/L — ABNORMAL LOW (ref 98–111)
Chloride: 96 mmol/L — ABNORMAL LOW (ref 98–111)
Creatinine, Ser: 1.97 mg/dL — ABNORMAL HIGH (ref 0.44–1.00)
Creatinine, Ser: 1.35 mg/dL — ABNORMAL HIGH (ref 0.44–1.00)
GFR calc Af Amer: 31 mL/min — ABNORMAL LOW (ref >60)
GFR calc Af Amer: 49 mL/min — ABNORMAL LOW (ref >60)
GFR calc non Af Amer: 27 mL/min — ABNORMAL LOW (ref >60)
GFR calc non Af Amer: 43 mL/min — ABNORMAL LOW (ref >60)
Glucose, Bld: 135 mg/dL — ABNORMAL HIGH (ref 70–99)
Glucose, Bld: 125 mg/dL — ABNORMAL HIGH (ref 70–99)
Potassium: 3.6 mmol/L (ref 3.5–5.1)
Potassium: 3.5 mmol/L (ref 3.5–5.1)
Sodium: 141 mmol/L (ref 135–145)
Sodium: 138 mmol/L (ref 135–145)
Total Bilirubin: 0.5 mg/dL (ref 0.3–1.2)
Total Bilirubin: 0.9 mg/dL (ref 0.3–1.2)
Total Protein: 9 g/dL — ABNORMAL HIGH (ref 6.5–8.1)
Total Protein: 7.2 g/dL (ref 6.5–8.1)

## 2019-12-31 LAB — CBC WITH DIFFERENTIAL/PLATELET
Abs Immature Granulocytes: 0.09 10*3/uL — ABNORMAL HIGH (ref 0.00–0.07)
Basophils Absolute: 0 10*3/uL (ref 0.0–0.1)
Basophils Relative: 0 %
Eosinophils Absolute: 0 10*3/uL (ref 0.0–0.5)
Eosinophils Relative: 0 %
HCT: 44.6 % (ref 36.0–46.0)
Hemoglobin: 14.5 g/dL (ref 12.0–15.0)
Immature Granulocytes: 1 %
Lymphocytes Relative: 15 %
Lymphs Abs: 1.6 10*3/uL (ref 0.7–4.0)
MCH: 30 pg (ref 26.0–34.0)
MCHC: 32.5 g/dL (ref 30.0–36.0)
MCV: 92.1 fL (ref 80.0–100.0)
Monocytes Absolute: 0.5 10*3/uL (ref 0.1–1.0)
Monocytes Relative: 5 %
Neutro Abs: 8.2 10*3/uL — ABNORMAL HIGH (ref 1.7–7.7)
Neutrophils Relative %: 79 %
Platelets: 282 10*3/uL (ref 150–400)
RBC: 4.84 MIL/uL (ref 3.87–5.11)
RDW: 14.2 % (ref 11.5–15.5)
WBC: 10.4 10*3/uL (ref 4.0–10.5)
nRBC: 0 % (ref 0.0–0.2)

## 2019-12-31 LAB — BLOOD GAS, VENOUS
Acid-Base Excess: 8.2 mmol/L — ABNORMAL HIGH (ref 0.0–2.0)
Bicarbonate: 38.5 mmol/L — ABNORMAL HIGH (ref 20.0–28.0)
O2 Saturation: 20.5 %
Patient temperature: 37
pCO2, Ven: 82 mmHg (ref 44.0–60.0)
pH, Ven: 7.28 (ref 7.250–7.430)
pO2, Ven: <31 mmHg — CL (ref 32.0–45.0)

## 2019-12-31 LAB — ETHANOL: Alcohol, Ethyl (B): <10 mg/dL (ref <10)

## 2019-12-31 LAB — SARS CORONAVIRUS 2 BY RT PCR (HOSPITAL ORDER, PERFORMED IN ~~LOC~~ HOSPITAL LAB): SARS Coronavirus 2: NEGATIVE

## 2019-12-31 LAB — TROPONIN I (HIGH SENSITIVITY)
Troponin I (High Sensitivity): 29 ng/L — ABNORMAL HIGH (ref <18)
Troponin I (High Sensitivity): 29 ng/L — ABNORMAL HIGH (ref <18)

## 2019-12-31 MED ORDER — IPRATROPIUM-ALBUTEROL 0.5-2.5 (3) MG/3ML IN SOLN: 3.0000 mL | Freq: Once | RESPIRATORY_TRACT | Status: DC

## 2019-12-31 MED ORDER — NALOXONE HCL 2 MG/2ML IJ SOSY
0.4000 mg | PREFILLED_SYRINGE | Freq: Once | INTRAMUSCULAR | Status: AC
  Administered 2019-12-31: 0.4 mg via INTRAVENOUS
  Filled 2019-12-31: qty 2

## 2019-12-31 MED ORDER — ONDANSETRON HCL 4 MG/2ML IJ SOLN
4.0000 mg | Freq: Once | INTRAMUSCULAR | Status: AC
  Administered 2019-12-31: 4 mg via INTRAVENOUS
  Filled 2019-12-31: qty 2

## 2019-12-31 MED ORDER — IPRATROPIUM-ALBUTEROL 0.5-2.5 (3) MG/3ML IN SOLN
3.0000 mL | Freq: Once | RESPIRATORY_TRACT | Status: AC
  Administered 2019-12-31: 3 mL via RESPIRATORY_TRACT
  Filled 2019-12-31: qty 3

## 2019-12-31 MED ORDER — SODIUM CHLORIDE 0.9 % IV BOLUS
1000.0000 mL | Freq: Once | INTRAVENOUS | Status: AC
  Administered 2019-12-31: 1000 mL via INTRAVENOUS

## 2019-12-31 NOTE — ED Provider Notes (Signed)
p  [facility] Emergency Department Provider Note  ____________________________________________   First MD Initiated Contact with Patient 12/31/19 [phone]     (approximate)  I have reviewed the triage vital signs and the nursing notes.   HISTORY  Chief Complaint Drug Overdose    HPI Claudia Christensen is a 59 y.o. female  With h/o HTN, COPD, here with accidental overdose. Per report, pt was found in her house by her significant other unresponsive. EMS was called to the scene. The s.o. had originally reported pt was pulseless - unclear if she underwent CPR or not, but on Fire arrival she had a pulse but was bradypneic and hypoxic. She was given narcan with improvement in RR and mental status. Since then, she has been drowsy but intermittently agitated. Admits to snorting heroin prior to the episode. She c/o some moderate anterior chest pain currently, no other complaints though limited 2/2 drowsiness and intoxication.  Level 5 caveat invoked as remainder of history, ROS, and physical exam limited due to patient's mental status/overdose.         Past Medical History:  Diagnosis Date  . COPD (chronic obstructive pulmonary disease) (HCC)   . Hypertension     Patient Active Problem List   Diagnosis Date Noted  . Temporal and frontal headaches (intermittent) (Right) 08/23/2018  . Vitamin D deficiency 08/23/2018  . DDD (degenerative disc disease), lumbar (L2-3, L4-5) 08/23/2018  . Chronic pain syndrome 07/21/2018  . Pharmacologic therapy 07/21/2018  . Disorder of skeletal system 07/21/2018  . Problems influencing health status 07/21/2018  . Chronic low back pain (Primary area of Pain) (Bilateral) (R>L) w/o sciatica 07/21/2018  . Chronic lower extremity pain (Secondary Area of Pain) (Bilateral) (R>L) 07/21/2018  . Chronic sacroiliac joint pain [facility] Area of Pain) (Bilateral) (R>L) 07/21/2018    Past Surgical History:  Procedure Laterality Date  . brain  aneurism  2011   removed    Prior to Admission medications   Medication Sig Start Date End Date Taking? Authorizing Provider  calcium carbonate (CALCIUM 600) 600 MG TABS tablet Take 1 tablet (600 mg total) by mouth 2 (two) times daily with a meal for 30 days. 08/23/18 09/22/18  Delano Metz, MD  lisinopril (PRINIVIL,ZESTRIL) 5 MG tablet Take 5 mg by mouth daily. 07/01/18   [provider]  meloxicam (MOBIC) 15 MG tablet Take 1 tablet (15 mg total) by mouth daily. 08/23/18 10/22/18  Delano Metz, MD  PROAIR HFA 108 [phone] Base) MCG/ACT inhaler Inhale 1-2 puffs into the lungs every 6 (six) hours as needed. 07/02/18   [provider]  SPIRIVA HANDIHALER 18 MCG inhalation capsule Place 1-2 puffs into inhaler and inhale 2 (two) times daily. 06/04/18   [provider]    Allergies Patient has no known allergies.  Family History  Problem Relation Age of Onset  . Diabetes Mother     Social History Social History   Tobacco Use  . Smoking status: Current Some Day Smoker  . Smokeless tobacco: Never Used  Substance Use Topics  . Alcohol use: Never  . Drug use: Never    Review of Systems  Review of Systems  Unable to perform ROS: Mental status change     ____________________________________________  PHYSICAL EXAM:      VITAL SIGNS: ED Triage Vitals  Enc Vitals Group     BP 12/31/19 0400 102/84     Pulse Rate 12/31/19 0400 94     Resp 12/31/19 0400 10  Temp 12/31/19 0400 98.9 F (37.2 C)     Temp Source 12/31/19 0400 Oral     SpO2 12/31/19 0400 90 %     Weight 12/31/19 0401 160 lb (72.6 kg)     Height 12/31/19 0401 5\' 4"  (1.626 m)     Head Circumference --      Peak Flow --      Pain Score --      Pain Loc --      Pain Edu? --      Excl. in GC? --      Physical Exam Vitals and nursing note reviewed.  Constitutional:      General: She is not in acute distress.    Appearance: She is well-developed.  HENT:     Head: Normocephalic and  atraumatic.     Mouth/Throat:     Mouth: Mucous membranes are dry.  Eyes:     Comments: Pinpoint pupils  Cardiovascular:     Rate and Rhythm: Normal rate and regular rhythm.     Heart sounds: Normal heart sounds.  Pulmonary:     Effort: Respiratory distress present.     Breath sounds: Rhonchi present. No wheezing.  Abdominal:     General: Abdomen is flat. There is no distension.  Musculoskeletal:     Cervical back: Neck supple.  Skin:    General: Skin is warm.     Capillary Refill: Capillary refill takes less than 2 seconds.     Findings: No rash.  Neurological:     Mental Status: She is alert. She is disoriented.     Motor: No abnormal muscle tone.       ____________________________________________   LABS (all labs ordered are listed, but only abnormal results are displayed)  Labs Reviewed  CBC WITH DIFFERENTIAL/PLATELET - Abnormal; Notable for the following components:      Result Value   Neutro Abs 8.2 (*)    Abs Immature Granulocytes 0.09 (*)    All other components within normal limits  COMPREHENSIVE METABOLIC PANEL - Abnormal; Notable for the following components:   Chloride 96 (*)    Glucose, Bld 135 (*)    BUN 39 (*)    Creatinine, Ser 1.97 (*)    Total Protein 9.0 (*)    AST 125 (*)    ALT 85 (*)    GFR calc non Af Amer 27 (*)    GFR calc Af Amer 31 (*)    All other components within normal limits  BLOOD GAS, VENOUS - Abnormal; Notable for the following components:   pCO2, Ven 82 (*)    pO2, Ven <31.0 (*)    Bicarbonate 38.5 (*)    Acid-Base Excess 8.2 (*)    All other components within normal limits  TROPONIN I (HIGH SENSITIVITY) - Abnormal; Notable for the following components:   Troponin I (High Sensitivity) 29 (*)    All other components within normal limits  ETHANOL  URINE DRUG SCREEN, QUALITATIVE ([facility] ONLY)    ____________________________________________  EKG: Sinus tachycardia, VR 101. QRS 74, QTc 467. No acute ST elevations or  depressions. No EKG evidence of ischemia or infarct. ________________________________________  RADIOLOGY All imaging, including plain films, CT scans, and ultrasounds, independently reviewed by me, and interpretations confirmed via formal radiology reads.  ED MD interpretation:   CXR: Bibasilar atelectasis, no PTX  Official radiology report(s): DG Chest Portable 1 View  Result Date: 12/31/2019 CLINICAL DATA:  Found unresponsive. Heroin use. Shortness of breath. EXAM: PORTABLE  CHEST 1 VIEW COMPARISON:  None. FINDINGS: Bibasilar atelectasis. No focal airspace consolidation. No pleural effusion or pneumothorax. Normal cardiomediastinal contours. IMPRESSION: Bibasilar atelectasis. Electronically Signed   By: Deatra Robinson M.D.   On: 12/31/2019 04:38    ____________________________________________  PROCEDURES   Procedure(s) performed (including Critical Care):  Procedures  ____________________________________________  INITIAL IMPRESSION / MDM / ASSESSMENT AND PLAN / ED COURSE  As part of my medical decision making, I reviewed the following data within the electronic MEDICAL RECORD NUMBER Nursing notes reviewed and incorporated, Old chart reviewed, Notes from prior ED visits, and [place] Controlled Substance Database       *Claudia Christensen was evaluated in Emergency Department on 12/31/2019 for the symptoms described in the history of present illness. She was evaluated in the context of the global COVID-19 pandemic, which necessitated consideration that the patient might be at risk for infection with the SARS-CoV-2 virus that causes COVID-19. Institutional protocols and algorithms that pertain to the evaluation of patients at risk for COVID-19 are in a state of rapid change based on information released by regulatory bodies including the CDC and federal and state organizations. These policies and algorithms were followed during the patient's care in the ED.  Some ED evaluations and interventions may be  delayed as a result of limited staffing during the pandemic.*     Medical Decision Making:  59 yo F here with suspsected accidental heroin overdose, Pt also endorses cocaine use. On arrival, she is hypoxic, mildly bradypneic though intermittently agitated. Initial gas shows mild resp acidosis likely from her narcosis. Given narcan with improvement. Otherwise, CXR shows atelectasis likely from her hypoventilation. No known fever or signs of PNA. Labs show likely AKI, possible alcohol related AST/ALT elevation. Will given fluids. Pt very agitated intermittently in ED but she quickly falls asleep and remains intoxicated clinically - IVCed for safety and reassessment once sober. Fluids given for AKI.   ____________________________________________  FINAL CLINICAL IMPRESSION(S) / ED DIAGNOSES  Final diagnoses:  Polysubstance abuse (HCC)  AKI (acute kidney injury) (HCC)     MEDICATIONS GIVEN DURING THIS VISIT:  Medications  ipratropium-albuterol (DUONEB) 0.5-2.5 (3) MG/3ML nebulizer solution 3 mL (3 mLs Nebulization Given 12/31/19 6503)  sodium chloride 0.9 % bolus 1,000 mL (0 mLs Intravenous Stopped 12/31/19 0931)  naloxone (NARCAN) injection 0.4 mg (0.4 mg Intravenous Given 12/31/19 0636)     ED Discharge Orders    None       Note:  This document was prepared using Dragon voice recognition software and may include unintentional dictation errors.   Shaune Pollack, MD 12/31/19 [phone]

## 2019-12-31 NOTE — ED Notes (Signed)
IVC, no consult ordered

## 2019-12-31 NOTE — ED Notes (Signed)
Pt off NRB - after 2 min sats holding at 97%

## 2019-12-31 NOTE — ED Notes (Signed)
This NT at bedside.

## 2019-12-31 NOTE — ED Provider Notes (Signed)
-----------------------------------------   11:57 AM on 12/31/2019 -----------------------------------------  We have taken the patient off of oxygen, she was able to ambulate to the restroom and back to her bed.  Patient states central chest pain however this is likely due to chest compressions.  However patient is persistently hypoxic into the 70s.  Patient is awake alert speaking with no difficulty.  Patient satting around 90% on 4 L.  No baseline O2 requirement.  We will repeat a chest x-ray, CMP and troponin as a precaution.  Patient extremities are cool to the touch we have placed the patient under blankets and will reassess oxygenation/pulse ox reading.  ----------------------------------------- 12:43 PM on 12/31/2019 -----------------------------------------  EKG viewed and interpreted by myself shows sinus tachycardia 103 bpm with a narrow QRS, normal axis, normal intervals, no concerning ST changes.  ----------------------------------------- 2:12 PM on 12/31/2019 -----------------------------------------  Repeat labs largely unchanged, troponin unchanged.  CMP is improved if anything.  Patient continues to appear well currently satting 94% on room air during my evaluation.  Patient is once again requesting to leave.  I have rescinded the patient's IVC.  We will discharge the patient home per her wishes.   Minna Antis, MD 12/31/19 1413

## 2019-12-31 NOTE — ED Triage Notes (Signed)
Pt's boyfriend found pt unresonsive. 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 Ems states fsbs in 400s. Pt alert to loud stimuli, non rebreather in place.

## 2019-12-31 NOTE — ED Notes (Signed)
son, Christiane Ha, at bedside, updated on plan of care  Pt refusing blood work d/t being "scared of needles"

## 2019-12-31 NOTE — ED Notes (Signed)
O2 sats now at 88% RA NRB started back att

## 2019-12-31 NOTE — ED Notes (Signed)
Report to noel, rn.  

## 2019-12-31 NOTE — ED Notes (Signed)
Pt refuses IV initiation at this time. md notified.

## 2020-01-16 ENCOUNTER — Encounter: Payer: Self-pay | Admitting: Emergency Medicine

## 2020-01-16 ENCOUNTER — Emergency Department
Admission: EM | Admit: 2020-01-16 | Discharge: 2020-01-16 | Disposition: A | Discharge status: Home / Self Care | Payer: Medicaid Other | Attending: Emergency Medicine | Admitting: Emergency Medicine

## 2020-01-16 ENCOUNTER — Emergency Department: Payer: Medicaid Other

## 2020-01-16 ENCOUNTER — Other Ambulatory Visit: Payer: Self-pay

## 2020-01-16 DIAGNOSIS — J449 Chronic obstructive pulmonary disease, unspecified: Secondary | ICD-10-CM | POA: Diagnosis not present

## 2020-01-16 DIAGNOSIS — I10 Essential (primary) hypertension: Secondary | ICD-10-CM | POA: Insufficient documentation

## 2020-01-16 DIAGNOSIS — S299XXA Unspecified injury of thorax, initial encounter: Secondary | ICD-10-CM | POA: Diagnosis present

## 2020-01-16 DIAGNOSIS — F17200 Nicotine dependence, unspecified, uncomplicated: Secondary | ICD-10-CM | POA: Diagnosis not present

## 2020-01-16 DIAGNOSIS — Y939 Activity, unspecified: Secondary | ICD-10-CM | POA: Insufficient documentation

## 2020-01-16 DIAGNOSIS — S2243XA Multiple fractures of ribs, bilateral, initial encounter for closed fracture: Secondary | ICD-10-CM | POA: Insufficient documentation

## 2020-01-16 DIAGNOSIS — Y998 Other external cause status: Secondary | ICD-10-CM | POA: Insufficient documentation

## 2020-01-16 DIAGNOSIS — W19XXXA Unspecified fall, initial encounter: Secondary | ICD-10-CM | POA: Insufficient documentation

## 2020-01-16 DIAGNOSIS — S2249XA Multiple fractures of ribs, unspecified side, initial encounter for closed fracture: Secondary | ICD-10-CM

## 2020-01-16 DIAGNOSIS — Y929 Unspecified place or not applicable: Secondary | ICD-10-CM | POA: Insufficient documentation

## 2020-01-16 DIAGNOSIS — Z79899 Other long term (current) drug therapy: Secondary | ICD-10-CM | POA: Insufficient documentation

## 2020-01-16 LAB — CBC WITH DIFFERENTIAL/PLATELET
Abs Immature Granulocytes: 0.01 10*3/uL (ref 0.00–0.07)
Basophils Absolute: 0.1 10*3/uL (ref 0.0–0.1)
Basophils Relative: 1 %
Eosinophils Absolute: 0.2 10*3/uL (ref 0.0–0.5)
Eosinophils Relative: 3 %
HCT: 41.5 % (ref 36.0–46.0)
Hemoglobin: 13.6 g/dL (ref 12.0–15.0)
Immature Granulocytes: 0 %
Lymphocytes Relative: 45 %
Lymphs Abs: 2.7 10*3/uL (ref 0.7–4.0)
MCH: 30.4 pg (ref 26.0–34.0)
MCHC: 32.8 g/dL (ref 30.0–36.0)
MCV: 92.8 fL (ref 80.0–100.0)
Monocytes Absolute: 0.4 10*3/uL (ref 0.1–1.0)
Monocytes Relative: 6 %
Neutro Abs: 2.8 10*3/uL (ref 1.7–7.7)
Neutrophils Relative %: 45 %
Platelets: 339 10*3/uL (ref 150–400)
RBC: 4.47 MIL/uL (ref 3.87–5.11)
RDW: 13.6 % (ref 11.5–15.5)
WBC: 6.1 10*3/uL (ref 4.0–10.5)
nRBC: 0 % (ref 0.0–0.2)

## 2020-01-16 LAB — COMPREHENSIVE METABOLIC PANEL
ALT: 18 U/L (ref 0–44)
AST: 25 U/L (ref 15–41)
Albumin: 3.8 g/dL (ref 3.5–5.0)
Alkaline Phosphatase: 139 U/L — ABNORMAL HIGH (ref 38–126)
Anion gap: 11 (ref 5–15)
BUN: 17 mg/dL (ref 6–20)
CO2: 24 mmol/L (ref 22–32)
Calcium: 9.3 mg/dL (ref 8.9–10.3)
Chloride: 106 mmol/L (ref 98–111)
Creatinine, Ser: 0.74 mg/dL (ref 0.44–1.00)
GFR calc Af Amer: >60 mL/min (ref >60)
GFR calc non Af Amer: >60 mL/min (ref >60)
Glucose, Bld: 85 mg/dL (ref 70–99)
Potassium: 4.1 mmol/L (ref 3.5–5.1)
Sodium: 141 mmol/L (ref 135–145)
Total Bilirubin: 0.6 mg/dL (ref 0.3–1.2)
Total Protein: 7.6 g/dL (ref 6.5–8.1)

## 2020-01-16 LAB — TROPONIN I (HIGH SENSITIVITY): Troponin I (High Sensitivity): <2 ng/L (ref <18)

## 2020-01-16 MED ORDER — MELOXICAM 7.5 MG PO TABS: 7.5000 mg | ORAL_TABLET | Freq: Every day | ORAL | 1 refills | Status: DC

## 2020-01-16 MED ORDER — IOHEXOL 350 MG/ML SOLN
75.0000 mL | Freq: Once | INTRAVENOUS | Status: AC | PRN
  Administered 2020-01-16: 75 mL via INTRAVENOUS
  Filled 2020-01-16: qty 75

## 2020-01-16 MED ORDER — METHOCARBAMOL 500 MG PO TABS: 500.0000 mg | ORAL_TABLET | Freq: Three times a day (TID) | ORAL | 0 refills | Status: AC | PRN

## 2020-01-16 NOTE — ED Triage Notes (Signed)
Pt states thinks she broke her ribs. Pt states she thinks she fell approx 2 weeks ago, 2 weeks ago pt seen here for drug overdose. Pt states pain to generalized rib cage, states pain worse with coughing. Pt states she thinks her ribs are broken.

## 2020-01-16 NOTE — ED Notes (Signed)
EKG viewed and signed by Dr. Katrinka Blazing

## 2020-01-16 NOTE — ED Provider Notes (Signed)
Emergency Department Provider Note  ____________________________________________  Time seen: Approximately 5:14 PM  I have reviewed the triage vital signs and the nursing notes.   HISTORY  Chief Complaint Rib Injury   Historian Patient     HPI Claudia Christensen is a 59 y.o. female presents to the emergency department with anterior chest wall pain which is worse with deep inspiration and with cough.  Patient states that she experienced a fall 2 weeks ago and it was uncertain whether or not she had CPR in the setting of a substance use overdose.  Patient states that for the first 4 days, she experienced some mild chest discomfort but was able to rest in her bed.  She states that the pain has worsened in intensity and she has now unable to lay in a flat position.  She states that she feels breathless and that her pain is only temporarily improved with Tylenol.  She is a daily smoker.  She denies recent surgery or prolonged immobilization.  She does have history of recent trauma as described above.  She denies abdominal pain.  No other alleviating measures have been attempted.   Past Medical History:  Diagnosis Date  . COPD (chronic obstructive pulmonary disease) (HCC)   . Hypertension      Immunizations up to date:  Yes.     Past Medical History:  Diagnosis Date  . COPD (chronic obstructive pulmonary disease) (HCC)   . Hypertension     Patient Active Problem List   Diagnosis Date Noted  . Temporal and frontal headaches (intermittent) (Right) 08/23/2018  . Vitamin D deficiency 08/23/2018  . DDD (degenerative disc disease), lumbar (L2-3, L4-5) 08/23/2018  . Chronic pain syndrome 07/21/2018  . Pharmacologic therapy 07/21/2018  . Disorder of skeletal system 07/21/2018  . Problems influencing health status 07/21/2018  . Chronic low back pain (Primary area of Pain) (Bilateral) (R>L) w/o sciatica 07/21/2018  . Chronic lower extremity pain (Secondary Area of Pain) (Bilateral) (R>L)  07/21/2018  . Chronic sacroiliac joint pain [facility] Area of Pain) (Bilateral) (R>L) 07/21/2018    Past Surgical History:  Procedure Laterality Date  . 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 Authorizing Provider  calcium carbonate (CALCIUM 600) 600 MG TABS tablet Take 1 tablet (600 mg total) by mouth 2 (two) times daily with a meal for 30 days. 08/23/18 09/22/18  Delano Metz, MD  lisinopril (PRINIVIL,ZESTRIL) 5 MG tablet Take 5 mg by mouth daily. 07/01/18   [provider]  meloxicam (MOBIC) 7.5 MG tablet Take 1 tablet (7.5 mg total) by mouth daily for 7 days. 01/16/20 01/23/20  Orvil Feil, PA-C  methocarbamol (ROBAXIN) 500 MG tablet Take 1 tablet (500 mg total) by mouth every 8 (eight) hours as needed for up to 5 days. 01/16/20 01/21/20  Orvil Feil, PA-C  PROAIR HFA 108 [phone] Base) MCG/ACT inhaler Inhale 1-2 puffs into the lungs every 6 (six) hours as needed. 07/02/18   [provider]  SPIRIVA HANDIHALER 18 MCG inhalation capsule Place 1-2 puffs into inhaler and inhale 2 (two) times daily. 06/04/18   [provider]    Allergies Patient has no known allergies.  Family History  Problem Relation Age of Onset  . Diabetes Mother     Social History Social History   Tobacco Use  . Smoking status: Current Some Day Smoker  . Smokeless tobacco: Never Used  Substance Use Topics  .  Alcohol use: Never  . Drug use: Yes    Comment: Heroin     Review of Systems  Constitutional: No fever/chills Eyes:  No discharge ENT: No upper respiratory complaints. Respiratory: no cough. No SOB/ use of accessory muscles to breath Cardiac: Patient has chest wall pain.  Gastrointestinal:   No nausea, no vomiting.  No diarrhea.  No constipation. Musculoskeletal: Patient has anterior chest wall pain.  Skin: Negative for rash, abrasions, lacerations,  ecchymosis.    ____________________________________________   PHYSICAL EXAM:  VITAL SIGNS: ED Triage Vitals  Enc Vitals Group     BP 01/16/20 1516 121/89     Pulse Rate 01/16/20 1516 100     Resp 01/16/20 1516 20     Temp 01/16/20 1516 98.6 F (37 C)     Temp Source 01/16/20 1516 Oral     SpO2 01/16/20 1516 94 %     Weight --      Height --      Head Circumference --      Peak Flow --      Pain Score 01/16/20 1517 9     Pain Loc --      Pain Edu? --      Excl. in GC? --      Constitutional: Alert and oriented. Well appearing and in no acute distress. Eyes: Conjunctivae are normal. PERRL. EOMI. Head: Atraumatic. ENT:      Nose: No congestion/rhinnorhea.      Mouth/Throat: Mucous membranes are moist.  Neck: No stridor.  No cervical spine tenderness to palpation. Cardiovascular: Normal rate, regular rhythm. Normal S1 and S2.  Good peripheral circulation. Respiratory: Normal respiratory effort without tachypnea or retractions. Lungs CTAB. Good air entry to the bases with no decreased or absent breath sounds Gastrointestinal: Bowel sounds x 4 quadrants. Soft and nontender to palpation. No guarding or rigidity. No distention. Musculoskeletal: Full range of motion to all extremities. No obvious deformities noted Neurologic:  Normal for age. No gross focal neurologic deficits are appreciated.  Skin:  Skin is warm, dry and intact. No rash noted. Psychiatric: Mood and affect are normal for age. Speech and behavior are normal.   ____________________________________________   LABS (all labs ordered are listed, but only abnormal results are displayed)  Labs Reviewed  COMPREHENSIVE METABOLIC PANEL - Abnormal; Notable for the following components:      Result Value   Alkaline Phosphatase 139 (*)    All other components within normal limits  CBC WITH DIFFERENTIAL/PLATELET  TROPONIN I (HIGH SENSITIVITY)  TROPONIN I (HIGH SENSITIVITY)    ____________________________________________  EKG   ____________________________________________  RADIOLOGY Geraldo Pitter, personally viewed and evaluated these images (plain radiographs) as part of my medical decision making, as well as reviewing the written report by the radiologist.    CT Angio Chest PE W and/or Wo Contrast  Result Date: 01/16/2020 CLINICAL DATA:  Chest pain. Shortness of breath. Possible fall 2 weeks ago. Question rib fracture. Drug overdose. EXAM: CT ANGIOGRAPHY CHEST WITH CONTRAST TECHNIQUE: Multidetector CT imaging of the chest was performed using the standard protocol during bolus administration of intravenous contrast. Multiplanar CT image reconstructions and MIPs were obtained to evaluate the vascular anatomy. CONTRAST:  45mL OMNIPAQUE IOHEXOL 350 MG/ML SOLN COMPARISON:  Chest radiograph 12/31/2019 FINDINGS: Cardiovascular: The quality of this exam for evaluation of pulmonary embolism is moderate to good. No evidence of pulmonary embolism. Aortic atherosclerosis. Tortuous thoracic aorta. Mild cardiomegaly, without pericardial effusion. Mediastinum/Nodes: No mediastinal or hilar adenopathy. Lungs/Pleura:  No pleural fluid. Presumed secretions in the right mainstem bronchus. Moderate centrilobular emphysema. Poor inspiratory effort with areas of bibasilar subsegmental atelectasis or scar. Upper Abdomen: Normal imaged portions of the liver, spleen, stomach, pancreas, gallbladder, adrenal glands, kidneys. Abdominal aortic atherosclerosis. Musculoskeletal: Right fourth through sixth anterior rib fractures, likely acute/subacute. Fourth through sixth anterior left rib fractures are also favored to be acute or subacute. Review of the MIP images confirms the above findings. IMPRESSION: 1. No evidence of pulmonary embolism. 2. Bilateral anterior rib fractures,  acute/subacute. 3. Aortic Atherosclerosis (ICD10-I70.0) and Emphysema (ICD10-J43.9). Electronically Signed   By: Jeronimo Greaves M.D.   On: 01/16/2020 19:01    ____________________________________________    PROCEDURES  Procedure(s) performed:     Procedures     Medications  iohexol (OMNIPAQUE) 350 MG/ML injection 75 mL (75 mLs Intravenous Contrast Given 01/16/20 1841)     ____________________________________________   INITIAL IMPRESSION / ASSESSMENT AND PLAN / ED COURSE  Pertinent labs & imaging results that were available during my care of the patient were reviewed by me and considered in my medical decision making (see chart for details).  Assessment and plan Anterior Chest Wall Pain:  60 year old female presents to the emergency department with anterior chest wall pain that seemingly worsening over the past 2 weeks.  Patient was tachycardic and tachypneic at triage and was satting at 94%.  Differential diagnosis included rib fracture, PE, pneumothorax, chest wall contusion, costochondritis...  CBC and CMP were reassuring. Troponin was within reference range. CTA revealed no evidence of PE but did indicate bilateral anterior rib fractures in various stages of healing.  Patient was discharged with anti-inflammatory muscle relaxer. She was advised to follow-up with primary care as needed. Return precautions were given.  FINAL CLINICAL IMPRESSION(S) / ED DIAGNOSES  Final diagnoses:  Closed fracture of multiple ribs, unspecified laterality, initial encounter      NEW MEDICATIONS STARTED DURING THIS VISIT:  ED Discharge Orders         Ordered    meloxicam (MOBIC) 7.5 MG tablet  Daily,   Status:  Discontinued        01/16/20 1912    methocarbamol (ROBAXIN) 500 MG tablet  Every 8 hours PRN        01/16/20 1912    meloxicam (MOBIC) 7.5 MG tablet  Daily        01/16/20 1934              This chart was dictated using voice recognition software/Dragon. Despite best efforts to proofread, errors can occur which can change the meaning. Any change was purely unintentional.      Gasper Lloyd 01/16/20 2007    Gilles Chiquito, MD 01/16/20 2139

## 2020-10-10 ENCOUNTER — Other Ambulatory Visit: Payer: Self-pay | Admitting: Gastroenterology

## 2020-10-10 DIAGNOSIS — B182 Chronic viral hepatitis C: Secondary | ICD-10-CM

## 2020-10-26 ENCOUNTER — Other Ambulatory Visit: Payer: Self-pay

## 2020-10-26 ENCOUNTER — Emergency Department: Payer: Medicaid Other

## 2020-10-26 ENCOUNTER — Emergency Department
Admission: EM | Admit: 2020-10-26 | Discharge: 2020-10-26 | Disposition: A | Discharge status: Home / Self Care | Payer: Medicaid Other | Attending: Emergency Medicine | Admitting: Emergency Medicine

## 2020-10-26 DIAGNOSIS — Z7951 Long term (current) use of inhaled steroids: Secondary | ICD-10-CM | POA: Diagnosis not present

## 2020-10-26 DIAGNOSIS — F17200 Nicotine dependence, unspecified, uncomplicated: Secondary | ICD-10-CM | POA: Diagnosis not present

## 2020-10-26 DIAGNOSIS — K0889 Other specified disorders of teeth and supporting structures: Secondary | ICD-10-CM | POA: Diagnosis present

## 2020-10-26 DIAGNOSIS — J208 Acute bronchitis due to other specified organisms: Secondary | ICD-10-CM | POA: Diagnosis not present

## 2020-10-26 DIAGNOSIS — I10 Essential (primary) hypertension: Secondary | ICD-10-CM | POA: Diagnosis not present

## 2020-10-26 DIAGNOSIS — Z79899 Other long term (current) drug therapy: Secondary | ICD-10-CM | POA: Diagnosis not present

## 2020-10-26 DIAGNOSIS — K047 Periapical abscess without sinus: Secondary | ICD-10-CM | POA: Diagnosis not present

## 2020-10-26 DIAGNOSIS — Z20822 Contact with and (suspected) exposure to covid-19: Secondary | ICD-10-CM | POA: Insufficient documentation

## 2020-10-26 DIAGNOSIS — J449 Chronic obstructive pulmonary disease, unspecified: Secondary | ICD-10-CM | POA: Insufficient documentation

## 2020-10-26 LAB — CBC WITH DIFFERENTIAL/PLATELET
Abs Immature Granulocytes: 0.01 10*3/uL (ref 0.00–0.07)
Basophils Absolute: 0 10*3/uL (ref 0.0–0.1)
Basophils Relative: 0 %
Eosinophils Absolute: 0.1 10*3/uL (ref 0.0–0.5)
Eosinophils Relative: 2 %
HCT: 44.5 % (ref 36.0–46.0)
Hemoglobin: 15.3 g/dL — ABNORMAL HIGH (ref 12.0–15.0)
Immature Granulocytes: 0 %
Lymphocytes Relative: 25 %
Lymphs Abs: 1.6 10*3/uL (ref 0.7–4.0)
MCH: 30.7 pg (ref 26.0–34.0)
MCHC: 34.4 g/dL (ref 30.0–36.0)
MCV: 89.4 fL (ref 80.0–100.0)
Monocytes Absolute: 0.4 10*3/uL (ref 0.1–1.0)
Monocytes Relative: 7 %
Neutro Abs: 4.2 10*3/uL (ref 1.7–7.7)
Neutrophils Relative %: 66 %
Platelets: 249 10*3/uL (ref 150–400)
RBC: 4.98 MIL/uL (ref 3.87–5.11)
RDW: 13 % (ref 11.5–15.5)
WBC: 6.3 10*3/uL (ref 4.0–10.5)
nRBC: 0 % (ref 0.0–0.2)

## 2020-10-26 LAB — COMPREHENSIVE METABOLIC PANEL
ALT: 16 U/L (ref 0–44)
AST: 21 U/L (ref 15–41)
Albumin: 4.3 g/dL (ref 3.5–5.0)
Alkaline Phosphatase: 71 U/L (ref 38–126)
Anion gap: 11 (ref 5–15)
BUN: 15 mg/dL (ref 8–23)
CO2: 24 mmol/L (ref 22–32)
Calcium: 9.3 mg/dL (ref 8.9–10.3)
Chloride: 102 mmol/L (ref 98–111)
Creatinine, Ser: 0.97 mg/dL (ref 0.44–1.00)
GFR, Estimated: >60 mL/min (ref >60)
Glucose, Bld: 91 mg/dL (ref 70–99)
Potassium: 3.5 mmol/L (ref 3.5–5.1)
Sodium: 137 mmol/L (ref 135–145)
Total Bilirubin: 1 mg/dL (ref 0.3–1.2)
Total Protein: 8.1 g/dL (ref 6.5–8.1)

## 2020-10-26 LAB — RESP PANEL BY RT-PCR (FLU A&B, COVID) ARPGX2
Influenza A by PCR: NEGATIVE
Influenza B by PCR: NEGATIVE
SARS Coronavirus 2 by RT PCR: NEGATIVE

## 2020-10-26 MED ORDER — ONDANSETRON HCL 4 MG/2ML IJ SOLN: 4.0000 mg | Freq: Once | INTRAMUSCULAR | Status: DC

## 2020-10-26 MED ORDER — CLINDAMYCIN PHOSPHATE 600 MG/50ML IV SOLN
600.0000 mg | Freq: Once | INTRAVENOUS | Status: AC
  Administered 2020-10-26: 600 mg via INTRAVENOUS
  Filled 2020-10-26: qty 50

## 2020-10-26 MED ORDER — IBUPROFEN 800 MG PO TABS: 800.0000 mg | ORAL_TABLET | Freq: Three times a day (TID) | ORAL | 0 refills | Status: AC | PRN

## 2020-10-26 MED ORDER — MORPHINE SULFATE (PF) 4 MG/ML IV SOLN: 4.0000 mg | Freq: Once | INTRAVENOUS | Status: DC

## 2020-10-26 MED ORDER — HYDROCODONE-ACETAMINOPHEN 5-325 MG PO TABS
1.0000 | ORAL_TABLET | Freq: Once | ORAL | Status: AC
  Administered 2020-10-26: 1 via ORAL
  Filled 2020-10-26: qty 1

## 2020-10-26 MED ORDER — SODIUM CHLORIDE 0.9 % IV BOLUS
500.0000 mL | Freq: Once | INTRAVENOUS | Status: AC
  Administered 2020-10-26: 500 mL via INTRAVENOUS

## 2020-10-26 MED ORDER — AMOXICILLIN 500 MG PO CAPS: 500.0000 mg | ORAL_CAPSULE | Freq: Three times a day (TID) | ORAL | 0 refills | Status: AC

## 2020-10-26 MED ORDER — AMOXICILLIN 500 MG PO CAPS: 500.0000 mg | ORAL_CAPSULE | Freq: Three times a day (TID) | ORAL | 0 refills | Status: DC

## 2020-10-26 NOTE — ED Notes (Addendum)
See triage note  Dental pain for couple of days   Worse today  She has had fever  And presents with low grade temp to ED   Body aches  Some swelling noted to gumline

## 2020-10-26 NOTE — ED Triage Notes (Signed)
First Nurse Note:  C/O dental pain 'for a while'.  Dentist unable to see patient today, so referred to ED.  Arrives by EMS.

## 2020-10-26 NOTE — ED Notes (Signed)
ED Provider at bedside. Ultrasound IV by Dr. Katrinka Blazing.

## 2020-10-26 NOTE — ED Notes (Signed)
Warm blankets provided.

## 2020-10-26 NOTE — Discharge Instructions (Addendum)
OPTIONS FOR DENTAL FOLLOW UP CARE ° °[place] Department of Health and Human Services - Local Safety Net Dental Clinics °http://www.ncdhhs.gov/dph/oralhealth/services/safetynetclinics.htm °  °[facility] (336-562-3123) ° °[facility] [place] (919-933-9087) ° °[facility] ([phone] ext [phone]) ° °[facility] ([phone]) ° °[facility] ([phone]) °This clinic caters to the indigent population and is on a lottery system. °Location: °[facility], [address], [place] °Clinic Hours: °Wednesdays from 6pm - 9pm, patients seen by a lottery system. °For dates, call or go to [email or web address].edu/[facility]/patients/Dental-[facility] °Services: °Cleanings, fillings and simple extractions. °Payment Options: °DENTAL WORK IS FREE OF CHARGE. Bring proof of income or support. °Best way to get seen: °Arrive at 5:15 pm - this is a lottery, NOT first come/first serve, so arriving earlier will not increase your chances of being seen. °  °  °[facility] °[phone] °Select option 1 for emergencies °  °Location: °[facility], [address], [place] °Clinic Hours: °No walk-ins accepted - call the day before to schedule an appointment. °Check in times are 9:30 am and 1:30 pm. °Services: °Simple extractions, temporary fillings, pulpectomy/pulp debridement, uncomplicated abscess drainage. °Payment Options: °PAYMENT IS DUE AT THE TIME OF SERVICE.  Fee is usually $100-200, additional surgical procedures (e.g. abscess drainage) may be extra. °Cash, checks, Visa/MasterCard accepted.  Can file Medicaid if patient is covered for dental - patient should call case worker to check. °No discount for [facility] patients. °Best way to get seen: °MUST call the day before and get onto the schedule. Can usually be seen the next 1-2 days. No walk-ins accepted. °  °  °[facility] °[phone] °   °Location: °[facility], [address], [place] °Clinic Hours: °M, W, Th, F 8am or 1:30pm, Tues 9a or 1:30 - first come/first served. °Services: °Simple extractions, temporary fillings, uncomplicated abscess drainage.  You do not need to be an [place] resident. °Payment Options: °PAYMENT IS DUE AT THE TIME OF SERVICE. °Dental insurance, otherwise sliding scale - bring proof of income or support. °Depending on income and treatment needed, cost is usually $50-200. °Best way to get seen: °Arrive early as it is first come/first served. °  °  °[facility] °[phone] °  °Location: °[address] °Clinic Hours: °Mon-Thu 8a-5p °Services: °Most basic dental services including extractions and fillings. °Payment Options: °PAYMENT IS DUE AT THE TIME OF SERVICE. °Sliding scale, up to 50% off - bring proof if income or support. °Medicaid with dental option accepted. °Best way to get seen: °Call to schedule an appointment, can usually be seen within 2 weeks OR they will try to see walk-ins - show up at 8a or 2p (you may have to wait). °  °  °[facility] °919-245-2435 °[place] RESIDENTS ONLY °  °Location: °[facility], [address], [place], [place] [address] °Clinic Hours: By appointment only. °Monday - Thursday 8am-5pm, Friday 8am-12pm °Services: Cleanings, fillings, extractions. °Payment Options: °PAYMENT IS DUE AT THE TIME OF SERVICE. °Cash, Visa or MasterCard. Sliding scale - $30 minimum per service. °Best way to get seen: °Come in to office, complete packet and make an appointment - need proof of income °or support monies for each household member and proof of [place] residence. °Usually takes about a month to get in. °  °  °[facility] °[phone] °  °Location: °[address]   [place] °Clinic Hours: Walk-in Urgent Care Dental Services are offered Monday-Friday  mornings only. °The numbers of emergencies accepted daily is limited to the number of °providers available. °Maximum 15 - Mondays, Wednesdays & Thursdays °Maximum 10 - Tuesdays & Fridays °Services: °You do not need to be a [place] resident to be seen for a dental emergency. °Emergencies are defined as pain, swelling, abnormal bleeding, or dental trauma. Walkins will receive x-rays if needed. °NOTE: Dental cleaning is not an emergency. °Payment Options: °PAYMENT IS DUE AT THE TIME OF SERVICE. °Minimum co-pay is $40.00 for uninsured patients. °Minimum co-pay is $3.00 for Medicaid with dental coverage. °Dental Insurance is accepted and must be presented at time of visit. °Medicare does not cover dental. °Forms of payment: Cash, credit card, checks. °Best way to get seen: °If not previously registered with the clinic, walk-in dental registration begins at 7:15 am and is on a first come/first serve basis. °If previously registered with the clinic, call to make an appointment. °  °  °[facility] °[phone] °[place] RESIDENTS ONLY °  °Location: °[address], [place], [place] °Clinic Hours: °Mon-Thu 10a-2p °Services: Extractions only! °Payment Options: °FREE (donations accepted) - bring proof of income or support °Best way to get seen: °Call and schedule an appointment OR come at 8am on the 1st Monday of every month (except for holidays) when it is first come/first served. °  °  °[facility] °[phone] °  °Location: °[address], [place] °Clinic Hours: °Friday mornings °Services, Payment Options, Best way to get seen: °Call for info °

## 2020-10-26 NOTE — ED Provider Notes (Signed)
[facility] Emergency Department Provider Note  ____________________________________________   Event Date/Time   First MD Initiated Contact with Patient 10/26/20 1357     (approximate)  I have reviewed the triage vital signs and the nursing notes.   HISTORY  Chief Complaint Dental Pain    HPI Claudia Christensen is a 60 y.o. female with history of polysubstance abuse presents with tooth ache for 2 days, cough, fever, swelling to the face, inability to eat and drink for 2 days.  She denies vomiting or diarrhea.  Has been having to use her inhaler more often.  States it just do not feel well.    Past Medical History:  Diagnosis Date  . COPD (chronic obstructive pulmonary disease) (HCC)   . Hypertension     Patient Active Problem List   Diagnosis Date Noted  . Temporal and frontal headaches (intermittent) (Right) 08/23/2018  . Vitamin D deficiency 08/23/2018  . DDD (degenerative disc disease), lumbar (L2-3, L4-5) 08/23/2018  . Chronic pain syndrome 07/21/2018  . Pharmacologic therapy 07/21/2018  . Disorder of skeletal system 07/21/2018  . Problems influencing health status 07/21/2018  . Chronic low back pain (Primary area of Pain) (Bilateral) (R>L) w/o sciatica 07/21/2018  . Chronic lower extremity pain (Secondary Area of Pain) (Bilateral) (R>L) 07/21/2018  . Chronic sacroiliac joint pain [facility] Area of Pain) (Bilateral) (R>L) 07/21/2018    Past Surgical History:  Procedure Laterality Date  . 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 Authorizing Provider  ibuprofen (ADVIL) 800 MG tablet Take 1 tablet (800 mg total) by mouth every 8 (eight) hours as needed. 10/26/20  Yes Abella Shugart, Roselyn Bering, PA-C  amoxicillin (AMOXIL) 500 MG capsule Take 1 capsule (500 mg total) by mouth 3 (three) times daily. 10/26/20   Gerardine Peltz, Roselyn Bering, PA-C  calcium carbonate (CALCIUM 600) 600 MG TABS tablet Take 1  tablet (600 mg total) by mouth 2 (two) times daily with a meal for 30 days. 08/23/18 09/22/18  Delano Metz, MD  lisinopril (PRINIVIL,ZESTRIL) 5 MG tablet Take 5 mg by mouth daily. 07/01/18   [provider]  PROAIR HFA 108 (90 Base) MCG/ACT inhaler Inhale 1-2 puffs into the lungs every 6 (six) hours as needed. 07/02/18   [provider]  SPIRIVA HANDIHALER 18 MCG inhalation capsule Place 1-2 puffs into inhaler and inhale 2 (two) times daily. 06/04/18   [provider]    Allergies Patient has no known allergies.  Family History  Problem Relation Age of Onset  . Diabetes Mother     Social History Social History   Tobacco Use  . Smoking status: Current Some Day Smoker  . Smokeless tobacco: Never Used  Substance Use Topics  . Alcohol use: Never  . Drug use: Yes    Comment: Heroin    Review of Systems  Constitutional: Positive fever/chills Eyes: No visual changes. ENT: No sore throat.  Positive dental pain Respiratory: Positive cough Cardiovascular: Denies chest pain Gastrointestinal: Denies abdominal pain Genitourinary: Negative for dysuria. Musculoskeletal: Negative for back pain. Skin: Negative for rash. Psychiatric: no mood changes,     ____________________________________________   PHYSICAL EXAM:  VITAL SIGNS: ED Triage Vitals  Enc Vitals Group     BP 10/26/20 1324 (!) 156/120     Pulse Rate 10/26/20 1324 87     Resp 10/26/20 1324 20     Temp 10/26/20 1324 99.5 F (37.5 C)  Temp Source 10/26/20 1324 Oral     SpO2 10/26/20 1324 97 %     Weight 10/26/20 1324 170 lb (77.1 kg)     Height 10/26/20 1324 5\' 5"  (1.651 m)     Head Circumference --      Peak Flow --      Pain Score 10/26/20 1336 10     Pain Loc --      Pain Edu? --      Excl. in GC? --     Constitutional: Alert and oriented. Well appearing and in no acute distress. Eyes: Conjunctivae are normal.  Head: Atraumatic.  Positive left-sided facial swelling Nose: No  congestion/rhinnorhea. Mouth/Throat: Mucous membranes are moist.   Neck:  supple no lymphadenopathy noted Cardiovascular: Normal rate, regular rhythm. Heart sounds are normal Respiratory: Normal respiratory effort.  No retractions, lungs c t a, cough is dry and hacking Abd: soft nontender bs normal all 4 quad GU: deferred Musculoskeletal: FROM all extremities, warm and well perfused Neurologic:  Normal speech and language.  Skin:  Skin is warm, dry and intact. No rash noted. Psychiatric: Mood and affect are normal. Speech and behavior are normal.  ____________________________________________   LABS (all labs ordered are listed, but only abnormal results are displayed)  Labs Reviewed  CBC WITH DIFFERENTIAL/PLATELET - Abnormal; Notable for the following components:      Result Value   Hemoglobin 15.3 (*)    All other components within normal limits  RESP PANEL BY RT-PCR (FLU A&B, COVID) ARPGX2  COMPREHENSIVE METABOLIC PANEL   ____________________________________________   ____________________________________________  RADIOLOGY  Chest x-ray  ____________________________________________   PROCEDURES  Procedure(s) performed: No  Procedures    ____________________________________________   INITIAL IMPRESSION / ASSESSMENT AND PLAN / ED COURSE  Pertinent labs & imaging results that were available during my care of the patient were reviewed by me and considered in my medical decision making (see chart for details).   Patient 60 year old female presents with dental pain for 2 days.  See HPI.  Physical exam shows patient to appear unwell.  DDx: COVID, CAP, COPD exasperation, cellulitis, dental abscess  CBC, metabolic panel, COVID, chest x-ray   Patient's labs are reassuring, CBC, metabolic panel respiratory panel are all negative  X-ray reviewed by me confirmed by radiology to be negative  The patient was given clindamycin 600 mg IV due to cellulitis/dental abscess  to the left side of her face.  Prescription for amoxicillin and ibuprofen as she has a history of polysubstance abuse.  She is to follow-up with the dental clinic.  Return emergency department for worsening.  She is discharged stable condition.  Eliabeth Shoff was evaluated in Emergency Department on 10/26/2020 for the symptoms described in the history of present illness. She was evaluated in the context of the global COVID-19 pandemic, which necessitated consideration that the patient might be at risk for infection with the SARS-CoV-2 virus that causes COVID-19. Institutional protocols and algorithms that pertain to the evaluation of patients at risk for COVID-19 are in a state of rapid change based on information released by regulatory bodies including the CDC and federal and state organizations. These policies and algorithms were followed during the patient's care in the ED.    As part of my medical decision making, I reviewed the following data within the electronic MEDICAL RECORD NUMBER Nursing notes reviewed and incorporated, Labs reviewed , Old chart reviewed, Radiograph reviewed , Notes from prior ED visits and [place] Controlled Substance Database  ____________________________________________  FINAL CLINICAL IMPRESSION(S) / ED DIAGNOSES  Final diagnoses:  Dental abscess  Acute bronchitis due to other specified organisms      NEW MEDICATIONS STARTED DURING THIS VISIT:  Discharge Medication List as of 10/26/2020  4:54 PM    START taking these medications   Details  ibuprofen (ADVIL) 800 MG tablet Take 1 tablet (800 mg total) by mouth every 8 (eight) hours as needed., Starting Fri 10/26/2020, Normal         Note:  This document was prepared using Dragon voice recognition software and may include unintentional dictation errors.    Faythe Ghee, PA-C 10/26/20 1940    Gilles Chiquito, MD 10/26/20 [phone]

## 2020-10-26 NOTE — ED Triage Notes (Signed)
Pt here with a toothache that started 2 days ago. Pt states that she has not been able to sleep or eat in 2 days.

## 2020-10-26 NOTE — ED Notes (Signed)
Patient reports "I'll have to call somebody," when asked how she will get home. Patient discharged to lobby at her request. Patient alert, oriented x4, with a cellphone in hand. Patient denies needs. Patient denies questions about discharge instructions or follow up.

## 2020-10-26 NOTE — ED Notes (Signed)
INT attempt by 2 RNs. Unsuccessful IV attempt x 2. Provider notified.

## 2020-12-24 ENCOUNTER — Other Ambulatory Visit: Payer: Self-pay

## 2020-12-24 ENCOUNTER — Ambulatory Visit
Admission: RE | Admit: 2020-12-24 | Discharge: 2020-12-24 | Disposition: A | Discharge status: Home / Self Care | Payer: Medicaid Other | Source: Ambulatory Visit | Attending: Gastroenterology | Admitting: Gastroenterology

## 2020-12-24 DIAGNOSIS — B182 Chronic viral hepatitis C: Secondary | ICD-10-CM | POA: Insufficient documentation

## 2021-07-22 ENCOUNTER — Emergency Department
Admission: EM | Admit: 2021-07-22 | Discharge: 2021-07-22 | Discharge status: Against Medical Advice | Payer: Medicaid Other | Source: Home / Self Care

## 2021-10-25 ENCOUNTER — Other Ambulatory Visit: Payer: Self-pay | Admitting: Neurology

## 2021-10-25 DIAGNOSIS — G8929 Other chronic pain: Secondary | ICD-10-CM

## 2021-11-05 ENCOUNTER — Inpatient Hospital Stay: Admission: RE | Admit: 2021-11-05 | Payer: Self-pay | Source: Ambulatory Visit

## 2021-11-12 ENCOUNTER — Other Ambulatory Visit: Payer: Medicaid Other

## 2023-01-23 IMAGING — US US ABDOMEN COMPLETE W/ ELASTOGRAPHY
1 series · 12 of 25 positions shown · non-contrast
Comparison: None.

CLINICAL DATA: Chronic hepatitis-C

EXAM:
ULTRASOUND ABDOMEN
ULTRASOUND HEPATIC ELASTOGRAPHY
TECHNIQUE: Sonography of the upper abdomen was performed. In addition,
ultrasound elastography evaluation of the liver was performed. A
region of interest was placed within the right lobe of the liver.
Following application of a compressive sonographic pulse, tissue
compressibility was assessed. Multiple assessments were performed at
the selected site. Median tissue compressibility was determined.
Previously, hepatic stiffness was assessed by shear wave velocity.
Based on recently published Society of Radiologists in Ultrasound
consensus article, reporting is now recommended to be performed in
the SI units of pressure (kiloPascals) representing hepatic
stiffness/elasticity. The obtained result is compared to the
published reference standards. (cACLD = compensated Advanced Chronic
Liver Disease)

[Series 1: us abdomen complete w/elastography · 12 of 122 slices shown]
[im 6/122]
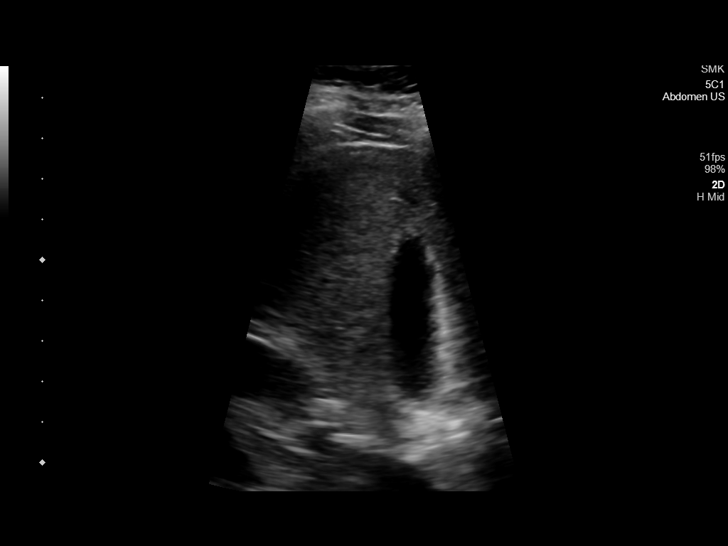
[im 16/122]
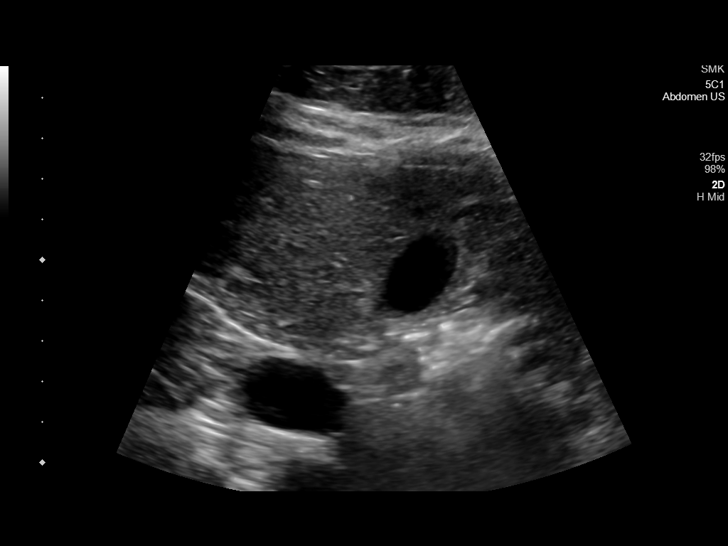
[im 26/122]
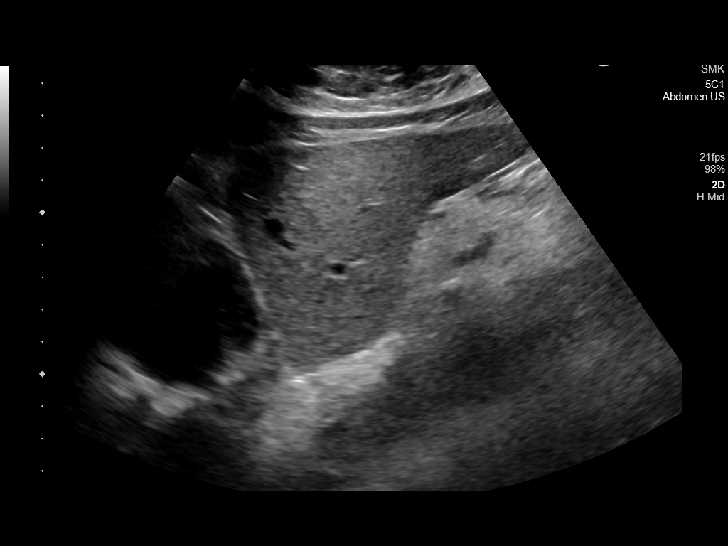
[im 36/122]
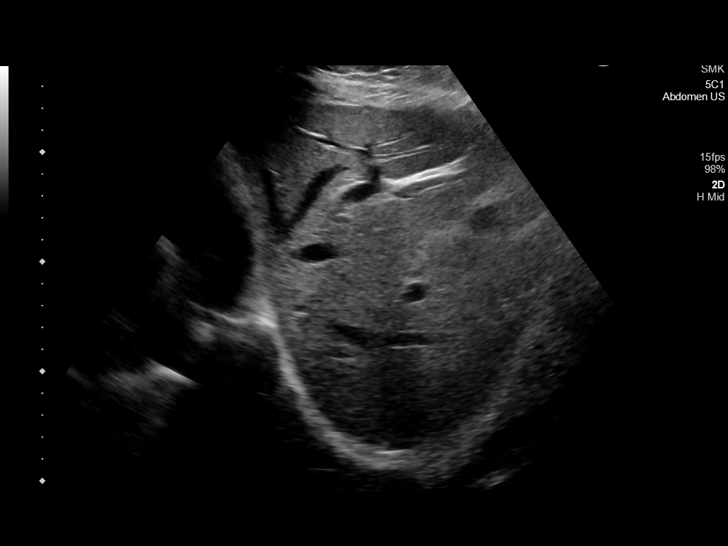
[im 46/122]
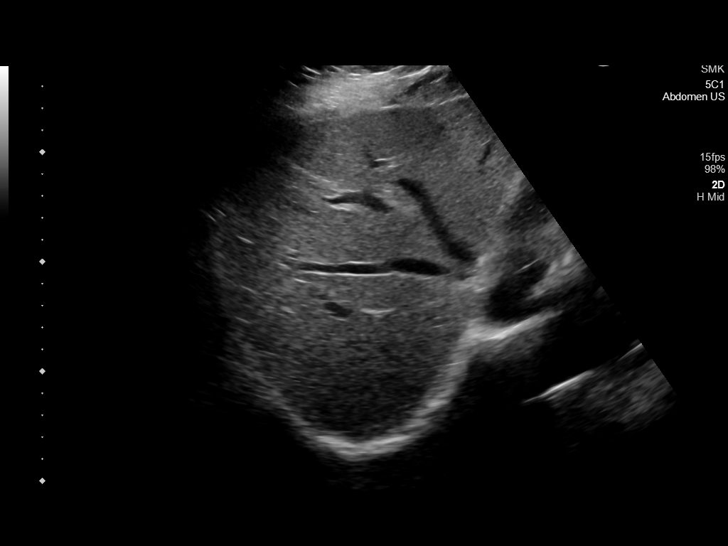
[im 56/122]
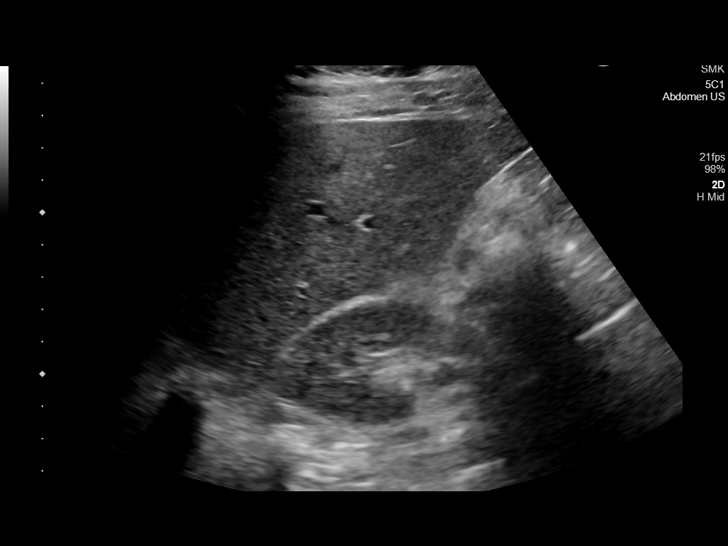
[im 66/122]
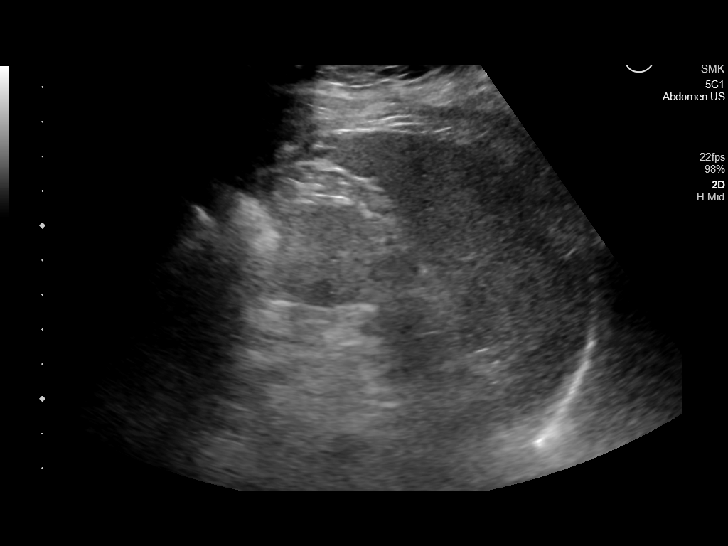
[im 76/122]
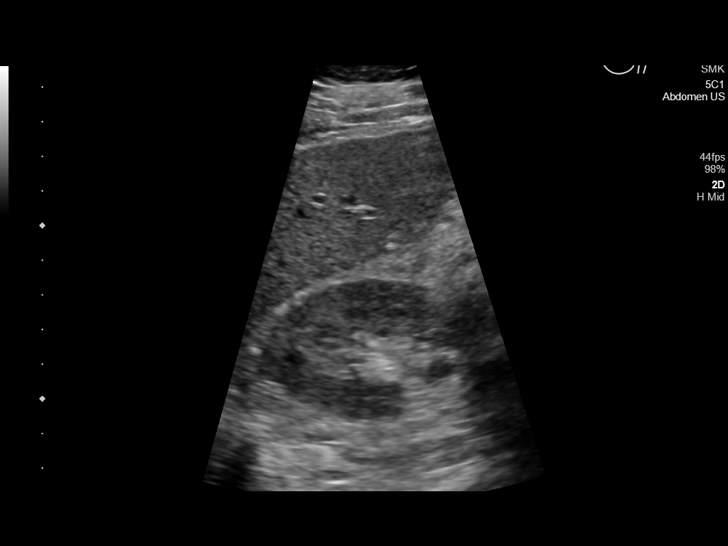
[im 86/122]
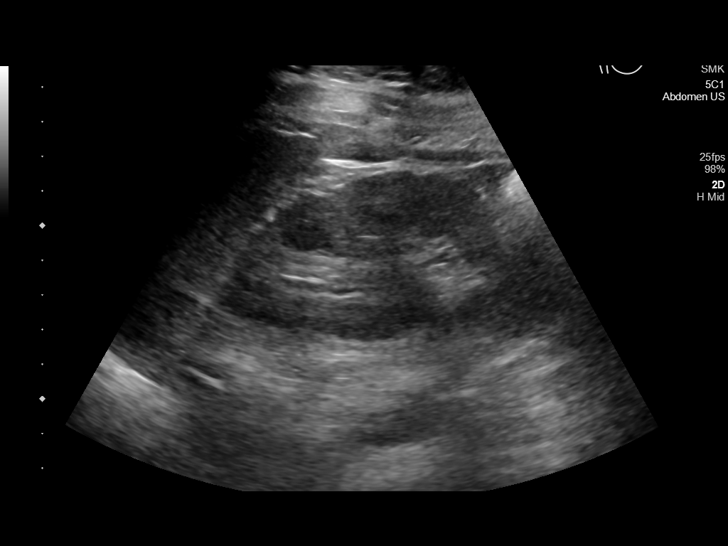
[im 96/122]
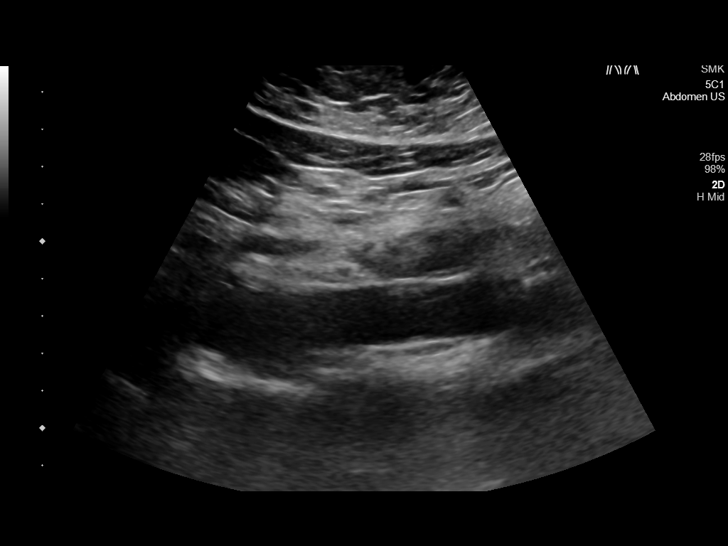
[im 106/122]
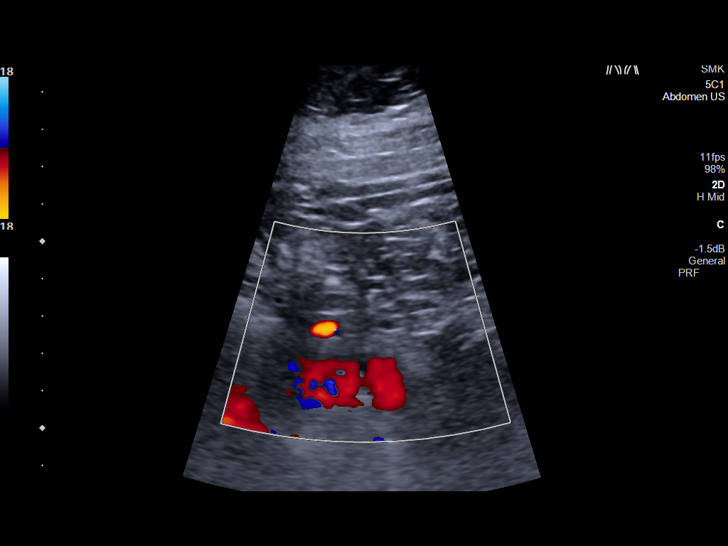
[im 116/122]
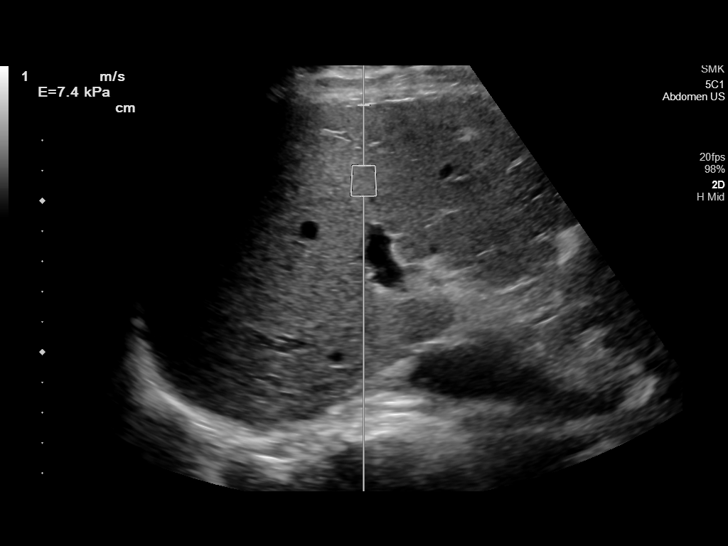

[12 of 25 positions shown; findings below may reference images not displayed]

FINDINGS: ULTRASOUND ABDOMEN

Gallbladder: No gallstones or wall thickening visualized. No
sonographic Murphy sign noted by sonographer.

Common bile duct: Diameter: 3 mm

Liver: No focal lesion identified. Within normal limits in
parenchymal echogenicity. Portal vein is patent on color Doppler
imaging with normal direction of blood flow towards the liver.

IVC: No abnormality visualized.

Pancreas: Visualized portion unremarkable.

Spleen: Size and appearance within normal limits.

Right Kidney: Length: 9.7 cm. Echogenicity within normal limits. No
mass or hydronephrosis visualized.

Left Kidney: Length: 10.2 cm. Echogenicity within normal limits. No
mass or hydronephrosis visualized.

Abdominal aorta: No aneurysm visualized.

Other findings: None.

ULTRASOUND HEPATIC ELASTOGRAPHY

Device: Siemens Helix VTQ

Patient position: Supine

Transducer 5C1

Number of measurements: 10

Hepatic segment:  8

Median kPa:

IQR: 1

IQR/Median kPa ratio:

Data quality:  Good

Diagnostic category: < or = 9 kPa: in the absence of other known
clinical signs, rules out cACLD

The use of hepatic elastography is applicable to patients with viral
hepatitis and non-alcoholic fatty liver disease. At this time, there
is insufficient data for the referenced cut-off values and use in
other causes of liver disease, including alcoholic liver disease.
Patients, however, may be assessed by elastography and serve as
their own reference standard/baseline.

In patients with non-alcoholic liver disease, the values suggesting
compensated advanced chronic liver disease (cACLD) may be lower, and
patients may need additional testing with elasticity results of [DATE]
kPa.

Please note that abnormal hepatic elasticity and shear wave
velocities may also be identified in clinical settings other than
with hepatic fibrosis, such as: acute hepatitis, elevated right
heart and central venous pressures including use of beta blockers,
Wali disease (Lete), infiltrative processes such as
mastocytosis/amyloidosis/infiltrative tumor/lymphoma, extrahepatic
cholestasis, with hyperemia in the post-prandial state, and with
liver transplantation. Correlation with patient history, laboratory
data, and clinical condition recommended.

Diagnostic Categories:

< or =5 kPa: high probability of being normal

< or =9 kPa: in the absence of other known clinical signs, rules [DATE] kPa and ?13 kPa: suggestive of cACLD, but needs further testing

>13 kPa: highly suggestive of cACLD

> or =17 kPa: highly suggestive of cACLD with an increased
probability of clinically significant portal hypertension
IMPRESSION: ULTRASOUND ABDOMEN:

Unremarkable sonographic appearance of the hepatic parenchyma.

ULTRASOUND HEPATIC ELASTOGRAPHY:

Median kPa:

Diagnostic category: < or = 9 kPa: in the absence of other known
clinical signs, rules out cACLD

## 2023-07-06 ENCOUNTER — Other Ambulatory Visit: Payer: Self-pay | Admitting: Nephrology

## 2023-07-06 DIAGNOSIS — N1831 Chronic kidney disease, stage 3a: Secondary | ICD-10-CM

## 2023-07-06 DIAGNOSIS — R8290 Unspecified abnormal findings in urine: Secondary | ICD-10-CM
